# Patient Record
Sex: Female | Born: 1935 | Race: White | Hispanic: No | Smoking: Former smoker
Health system: Southern US, Community
[De-identification: ages and names within clinical notes are randomized; demographics above are authoritative.]

## PROBLEM LIST (undated history)

## (undated) DIAGNOSIS — M810 Age-related osteoporosis without current pathological fracture: Secondary | ICD-10-CM

## (undated) DIAGNOSIS — G3183 Dementia with Lewy bodies: Secondary | ICD-10-CM

## (undated) DIAGNOSIS — H409 Unspecified glaucoma: Secondary | ICD-10-CM

## (undated) DIAGNOSIS — F05 Delirium due to known physiological condition: Principal | ICD-10-CM

## (undated) DIAGNOSIS — F0281 Dementia in other diseases classified elsewhere with behavioral disturbance: Secondary | ICD-10-CM

## (undated) DIAGNOSIS — M546 Pain in thoracic spine: Secondary | ICD-10-CM

## (undated) DIAGNOSIS — F02818 Dementia in other diseases classified elsewhere, unspecified severity, with other behavioral disturbance: Secondary | ICD-10-CM

## (undated) DIAGNOSIS — J449 Chronic obstructive pulmonary disease, unspecified: Secondary | ICD-10-CM

## (undated) DIAGNOSIS — M4800 Spinal stenosis, site unspecified: Secondary | ICD-10-CM

## (undated) DIAGNOSIS — I4510 Unspecified right bundle-branch block: Secondary | ICD-10-CM

## (undated) DIAGNOSIS — M25569 Pain in unspecified knee: Secondary | ICD-10-CM

## (undated) DIAGNOSIS — R002 Palpitations: Secondary | ICD-10-CM

## (undated) DIAGNOSIS — F419 Anxiety disorder, unspecified: Secondary | ICD-10-CM

## (undated) DIAGNOSIS — R269 Unspecified abnormalities of gait and mobility: Secondary | ICD-10-CM

## (undated) DIAGNOSIS — G43009 Migraine without aura, not intractable, without status migrainosus: Secondary | ICD-10-CM

## (undated) HISTORY — DX: Age-related osteoporosis without current pathological fracture: M81.0

## (undated) HISTORY — DX: Pain in unspecified knee: M25.569

## (undated) HISTORY — DX: Unspecified right bundle-branch block: I45.10

## (undated) HISTORY — PX: TONSILLECTOMY: SUR1361

## (undated) HISTORY — DX: Palpitations: R00.2

## (undated) HISTORY — PX: BRONCHOSCOPY: SUR163

## (undated) HISTORY — DX: Delirium due to known physiological condition: F05

## (undated) HISTORY — DX: Anxiety disorder, unspecified: F41.9

## (undated) HISTORY — DX: Pain in thoracic spine: M54.6

## (undated) HISTORY — DX: Unspecified abnormalities of gait and mobility: R26.9

## (undated) HISTORY — DX: Spinal stenosis, site unspecified: M48.00

## (undated) HISTORY — DX: Migraine without aura, not intractable, without status migrainosus: G43.009

## (undated) HISTORY — DX: Unspecified glaucoma: H40.9

## (undated) HISTORY — PX: CATARACT EXTRACTION: SUR2

## (undated) HISTORY — PX: EYE SURGERY: SHX253

---

## 1998-09-02 ENCOUNTER — Ambulatory Visit (HOSPITAL_COMMUNITY): Admission: RE | Admit: 1998-09-02 | Discharge: 1998-09-02 | Payer: Self-pay | Admitting: Internal Medicine

## 1998-09-02 ENCOUNTER — Encounter: Payer: Self-pay | Admitting: Internal Medicine

## 1999-06-01 ENCOUNTER — Ambulatory Visit (HOSPITAL_COMMUNITY): Admission: RE | Admit: 1999-06-01 | Discharge: 1999-06-01 | Payer: Self-pay | Admitting: Internal Medicine

## 1999-06-01 ENCOUNTER — Encounter: Payer: Self-pay | Admitting: Internal Medicine

## 1999-06-01 ENCOUNTER — Encounter (INDEPENDENT_AMBULATORY_CARE_PROVIDER_SITE_OTHER): Payer: Self-pay | Admitting: Specialist

## 1999-08-20 ENCOUNTER — Encounter: Admission: RE | Admit: 1999-08-20 | Discharge: 1999-08-20 | Payer: Self-pay | Admitting: Internal Medicine

## 1999-08-20 ENCOUNTER — Encounter: Payer: Self-pay | Admitting: Internal Medicine

## 2000-08-10 ENCOUNTER — Encounter: Payer: Self-pay | Admitting: Internal Medicine

## 2000-08-10 ENCOUNTER — Encounter: Admission: RE | Admit: 2000-08-10 | Discharge: 2000-08-10 | Payer: Self-pay | Admitting: Internal Medicine

## 2001-08-14 ENCOUNTER — Encounter: Admission: RE | Admit: 2001-08-14 | Discharge: 2001-08-14 | Payer: Self-pay | Admitting: Internal Medicine

## 2001-08-14 ENCOUNTER — Encounter: Payer: Self-pay | Admitting: Internal Medicine

## 2002-12-13 ENCOUNTER — Encounter: Admission: RE | Admit: 2002-12-13 | Discharge: 2002-12-13 | Payer: Self-pay | Admitting: Internal Medicine

## 2002-12-13 ENCOUNTER — Encounter: Payer: Self-pay | Admitting: Internal Medicine

## 2003-12-17 ENCOUNTER — Encounter: Admission: RE | Admit: 2003-12-17 | Discharge: 2003-12-17 | Payer: Self-pay | Admitting: Internal Medicine

## 2003-12-20 ENCOUNTER — Encounter: Admission: RE | Admit: 2003-12-20 | Discharge: 2003-12-20 | Payer: Self-pay | Admitting: Internal Medicine

## 2005-02-05 ENCOUNTER — Encounter: Admission: RE | Admit: 2005-02-05 | Discharge: 2005-02-05 | Payer: Self-pay | Admitting: Internal Medicine

## 2006-02-22 ENCOUNTER — Encounter: Admission: RE | Admit: 2006-02-22 | Discharge: 2006-02-22 | Payer: Self-pay | Admitting: Internal Medicine

## 2006-03-29 ENCOUNTER — Ambulatory Visit: Payer: Self-pay | Admitting: Internal Medicine

## 2006-04-11 ENCOUNTER — Encounter: Payer: Self-pay | Admitting: Cardiology

## 2006-04-11 ENCOUNTER — Ambulatory Visit: Payer: Self-pay

## 2006-04-11 ENCOUNTER — Encounter: Payer: Self-pay | Admitting: Internal Medicine

## 2006-04-19 ENCOUNTER — Ambulatory Visit: Payer: Self-pay | Admitting: Internal Medicine

## 2006-05-17 ENCOUNTER — Ambulatory Visit: Payer: Self-pay | Admitting: Family Medicine

## 2006-05-30 ENCOUNTER — Ambulatory Visit: Payer: Self-pay | Admitting: Internal Medicine

## 2006-06-15 ENCOUNTER — Ambulatory Visit: Payer: Self-pay | Admitting: Internal Medicine

## 2006-08-11 ENCOUNTER — Encounter: Payer: Self-pay | Admitting: Internal Medicine

## 2006-09-02 ENCOUNTER — Ambulatory Visit: Payer: Self-pay | Admitting: Internal Medicine

## 2006-10-14 ENCOUNTER — Ambulatory Visit: Payer: Self-pay | Admitting: Internal Medicine

## 2006-10-21 ENCOUNTER — Encounter: Payer: Self-pay | Admitting: Internal Medicine

## 2007-03-15 ENCOUNTER — Encounter: Admission: RE | Admit: 2007-03-15 | Discharge: 2007-03-15 | Payer: Self-pay | Admitting: Internal Medicine

## 2007-12-26 ENCOUNTER — Ambulatory Visit: Payer: Self-pay | Admitting: Internal Medicine

## 2007-12-26 DIAGNOSIS — R000 Tachycardia, unspecified: Secondary | ICD-10-CM

## 2007-12-26 DIAGNOSIS — M810 Age-related osteoporosis without current pathological fracture: Secondary | ICD-10-CM | POA: Insufficient documentation

## 2007-12-26 DIAGNOSIS — M25569 Pain in unspecified knee: Secondary | ICD-10-CM | POA: Insufficient documentation

## 2007-12-26 DIAGNOSIS — F411 Generalized anxiety disorder: Secondary | ICD-10-CM | POA: Insufficient documentation

## 2007-12-26 DIAGNOSIS — R269 Unspecified abnormalities of gait and mobility: Secondary | ICD-10-CM

## 2007-12-26 DIAGNOSIS — G43009 Migraine without aura, not intractable, without status migrainosus: Secondary | ICD-10-CM | POA: Insufficient documentation

## 2007-12-26 DIAGNOSIS — H409 Unspecified glaucoma: Secondary | ICD-10-CM | POA: Insufficient documentation

## 2007-12-26 LAB — CONVERTED CEMR LAB
AST: 20 units/L (ref 0–37)
Alkaline Phosphatase: 77 units/L (ref 39–117)
Basophils Absolute: 0.1 10*3/uL (ref 0.0–0.1)
Bilirubin, Direct: 0.1 mg/dL (ref 0.0–0.3)
Chloride: 109 meq/L (ref 96–112)
Creatinine, Ser: 1 mg/dL (ref 0.4–1.2)
Eosinophils Absolute: 0.1 10*3/uL (ref 0.0–0.6)
Free T4: 0.9 ng/dL (ref 0.6–1.6)
GFR calc non Af Amer: 57 mL/min
Lymphocytes Relative: 24.2 % (ref 12.0–46.0)
MCHC: 32.7 g/dL (ref 30.0–36.0)
MCV: 90.7 fL (ref 78.0–100.0)
Neutrophils Relative %: 64.1 % (ref 43.0–77.0)
Platelets: 209 10*3/uL (ref 150–400)
Potassium: 4.7 meq/L (ref 3.5–5.1)
Sodium: 144 meq/L (ref 135–145)
Total Bilirubin: 0.6 mg/dL (ref 0.3–1.2)

## 2007-12-27 ENCOUNTER — Ambulatory Visit: Payer: Self-pay | Admitting: Internal Medicine

## 2007-12-28 DIAGNOSIS — R519 Headache, unspecified: Secondary | ICD-10-CM | POA: Insufficient documentation

## 2007-12-28 DIAGNOSIS — M899 Disorder of bone, unspecified: Secondary | ICD-10-CM | POA: Insufficient documentation

## 2007-12-28 DIAGNOSIS — R51 Headache: Secondary | ICD-10-CM

## 2007-12-28 DIAGNOSIS — M949 Disorder of cartilage, unspecified: Secondary | ICD-10-CM

## 2008-01-01 ENCOUNTER — Telehealth: Payer: Self-pay | Admitting: Internal Medicine

## 2008-01-01 LAB — CONVERTED CEMR LAB: Vit D, 1,25-Dihydroxy: 27 — ABNORMAL LOW (ref 30–89)

## 2008-01-22 ENCOUNTER — Telehealth: Payer: Self-pay | Admitting: Internal Medicine

## 2008-03-19 ENCOUNTER — Encounter: Admission: RE | Admit: 2008-03-19 | Discharge: 2008-03-19 | Payer: Self-pay | Admitting: Internal Medicine

## 2008-03-21 ENCOUNTER — Encounter: Payer: Self-pay | Admitting: Internal Medicine

## 2008-03-21 ENCOUNTER — Ambulatory Visit: Payer: Self-pay | Admitting: Internal Medicine

## 2008-03-25 ENCOUNTER — Ambulatory Visit: Payer: Self-pay | Admitting: Internal Medicine

## 2008-03-25 LAB — CONVERTED CEMR LAB: Blood Glucose, Fingerstick: 152

## 2008-04-16 ENCOUNTER — Ambulatory Visit: Payer: Self-pay | Admitting: Internal Medicine

## 2008-04-16 DIAGNOSIS — M546 Pain in thoracic spine: Secondary | ICD-10-CM

## 2008-04-16 DIAGNOSIS — I4510 Unspecified right bundle-branch block: Secondary | ICD-10-CM | POA: Insufficient documentation

## 2008-06-03 ENCOUNTER — Ambulatory Visit: Payer: Self-pay | Admitting: Internal Medicine

## 2008-06-03 DIAGNOSIS — R030 Elevated blood-pressure reading, without diagnosis of hypertension: Secondary | ICD-10-CM

## 2008-08-12 ENCOUNTER — Ambulatory Visit: Payer: Self-pay | Admitting: Internal Medicine

## 2008-10-03 ENCOUNTER — Telehealth: Payer: Self-pay | Admitting: Internal Medicine

## 2008-12-06 ENCOUNTER — Ambulatory Visit: Payer: Self-pay | Admitting: Internal Medicine

## 2008-12-23 ENCOUNTER — Ambulatory Visit: Payer: Self-pay | Admitting: Internal Medicine

## 2008-12-23 ENCOUNTER — Encounter: Payer: Self-pay | Admitting: Internal Medicine

## 2008-12-23 DIAGNOSIS — R002 Palpitations: Secondary | ICD-10-CM | POA: Insufficient documentation

## 2008-12-23 DIAGNOSIS — R0789 Other chest pain: Secondary | ICD-10-CM

## 2008-12-27 ENCOUNTER — Ambulatory Visit: Payer: Self-pay | Admitting: Internal Medicine

## 2009-01-08 ENCOUNTER — Ambulatory Visit: Payer: Self-pay

## 2009-01-08 ENCOUNTER — Encounter: Payer: Self-pay | Admitting: Internal Medicine

## 2009-01-15 ENCOUNTER — Telehealth (INDEPENDENT_AMBULATORY_CARE_PROVIDER_SITE_OTHER): Payer: Self-pay | Admitting: *Deleted

## 2009-01-20 ENCOUNTER — Ambulatory Visit: Payer: Self-pay | Admitting: Internal Medicine

## 2009-01-20 ENCOUNTER — Encounter: Payer: Self-pay | Admitting: Internal Medicine

## 2009-02-19 ENCOUNTER — Telehealth: Payer: Self-pay | Admitting: Internal Medicine

## 2009-02-28 ENCOUNTER — Ambulatory Visit: Payer: Self-pay | Admitting: Internal Medicine

## 2009-02-28 DIAGNOSIS — R5381 Other malaise: Secondary | ICD-10-CM

## 2009-02-28 DIAGNOSIS — R5383 Other fatigue: Secondary | ICD-10-CM

## 2009-03-20 ENCOUNTER — Encounter: Admission: RE | Admit: 2009-03-20 | Discharge: 2009-03-20 | Payer: Self-pay | Admitting: Internal Medicine

## 2009-04-17 ENCOUNTER — Telehealth: Payer: Self-pay | Admitting: Internal Medicine

## 2009-04-24 ENCOUNTER — Telehealth (INDEPENDENT_AMBULATORY_CARE_PROVIDER_SITE_OTHER): Payer: Self-pay | Admitting: *Deleted

## 2009-07-28 ENCOUNTER — Ambulatory Visit: Payer: Self-pay | Admitting: Internal Medicine

## 2009-07-28 DIAGNOSIS — F17200 Nicotine dependence, unspecified, uncomplicated: Secondary | ICD-10-CM

## 2009-09-01 ENCOUNTER — Ambulatory Visit: Payer: Self-pay | Admitting: Internal Medicine

## 2009-09-05 ENCOUNTER — Ambulatory Visit: Payer: Self-pay | Admitting: Internal Medicine

## 2009-09-08 LAB — CONVERTED CEMR LAB
Albumin: 3.6 g/dL (ref 3.5–5.2)
Basophils Absolute: 0.1 10*3/uL (ref 0.0–0.1)
Bilirubin, Direct: 0 mg/dL (ref 0.0–0.3)
Chloride: 108 meq/L (ref 96–112)
Cholesterol: 221 mg/dL — ABNORMAL HIGH (ref 0–200)
Direct LDL: 129.6 mg/dL
Free T4: 1 ng/dL (ref 0.6–1.6)
HDL: 69.1 mg/dL (ref >39.00)
Hemoglobin: 13.9 g/dL (ref 12.0–15.0)
Lymphocytes Relative: 26.4 % (ref 12.0–46.0)
Monocytes Relative: 9.5 % (ref 3.0–12.0)
Neutro Abs: 3.2 10*3/uL (ref 1.4–7.7)
Potassium: 4 meq/L (ref 3.5–5.1)
RDW: 12.9 % (ref 11.5–14.6)
TSH: 1.87 microintl units/mL (ref 0.35–5.50)
Total Protein: 6.3 g/dL (ref 6.0–8.3)
VLDL: 25.2 mg/dL (ref 0.0–40.0)
WBC: 5.5 10*3/uL (ref 4.5–10.5)

## 2009-12-29 ENCOUNTER — Ambulatory Visit: Payer: Self-pay | Admitting: Internal Medicine

## 2009-12-29 DIAGNOSIS — M1990 Unspecified osteoarthritis, unspecified site: Secondary | ICD-10-CM | POA: Insufficient documentation

## 2010-04-10 ENCOUNTER — Encounter: Admission: RE | Admit: 2010-04-10 | Discharge: 2010-04-10 | Payer: Self-pay | Admitting: Internal Medicine

## 2010-04-10 LAB — HM MAMMOGRAPHY

## 2010-05-29 ENCOUNTER — Encounter: Payer: Self-pay | Admitting: Internal Medicine

## 2010-06-07 ENCOUNTER — Encounter: Payer: Self-pay | Admitting: Internal Medicine

## 2010-07-08 ENCOUNTER — Encounter: Admission: RE | Admit: 2010-07-08 | Discharge: 2010-07-08 | Payer: Self-pay | Admitting: Family Medicine

## 2010-07-08 ENCOUNTER — Encounter: Payer: Self-pay | Admitting: Internal Medicine

## 2010-07-10 ENCOUNTER — Encounter: Payer: Self-pay | Admitting: Internal Medicine

## 2010-07-13 ENCOUNTER — Ambulatory Visit: Payer: Self-pay | Admitting: Internal Medicine

## 2010-07-13 DIAGNOSIS — M479 Spondylosis, unspecified: Secondary | ICD-10-CM | POA: Insufficient documentation

## 2010-07-21 ENCOUNTER — Encounter
Admission: RE | Admit: 2010-07-21 | Discharge: 2010-09-24 | Payer: Self-pay | Source: Home / Self Care | Attending: Family Medicine | Admitting: Family Medicine

## 2010-11-05 NOTE — Letter (Signed)
Summary: [facility]-Recheck left leg  [facility]-Reche[facility] left leg   Imported By: Maryln Gottron 07/28/2010 09:54:49  _____________________________________________________________________  External Attachment:    Type:   Image     Comment:   External Document

## 2010-11-05 NOTE — Letter (Signed)
Summary: [facility]-Weakness in Legs  [facility]-Weakness in Legs   Imported By: Maryln Gottron 07/28/2010 09:50:29  _____________________________________________________________________  External Attachment:    Type:   Image     Comment:   External Document

## 2010-11-05 NOTE — Assessment & Plan Note (Signed)
Summary: 6 MTH ROV // RS   Vital Signs:  Patient profile:   77 year old female Menstrual status:  postmenopausal Height:      56 inches Weight:      103 pounds BMI:     23.18 Pulse rate:   88 / minute BP sitting:   110 / 80  (right arm) Cuff size:   regular  Vitals Entered By: Romualdo Bolk, CMA (AAMA) (July 13, 2010 1:28 PM) CC: follow-up visit   History of Present Illness: Breanna Dillon comes in today  for follow up of  multiple medical problems . However since her last visit with [place] she had an episode of hert left leg giving out and was seen in  [place] urgent [facility]        and  had x rays.     she denied any pain or specific falling or otherwise specific weakness. Of note this is different than the problem that she had with her right leg  that had previously been evaluated by neurology with the unclear diagnosis.  It was felt she had possible radiculopathy or neuritis and she was  rx with prednisone  which helped a good bit. help     .    an MRI was done of her spine and hasn't been told the results yet   .. Dr. Hal Hope told her that she hadarthritis in her back.   leg is good now .   however she still feels like she must hold onto things at times. hol  on.  Fell in [place]. under [employer] different circumstances. Has had MRI   this past week.    ? results .  when left leg feels wobbly gets discomfort tin upper back .no numbness or pain today.  she denies any chest pain shortness of breath difficulty with speech or cognition. She denies groin pain.    Preventive Screening-Counseling & Management  Alcohol-Tobacco     Alcohol drinks/day: <1     Alcohol type: beer     Smoking Status: quit < 6 months     Packs/Day: <0.25     Year Quit: 5 years ago  Caffeine-Diet-Exercise     Caffeine use/day: 2-3     Does Patient Exercise: no  Current Medications (verified): 1)  Travatan Z 0.004 %  Soln (Travoprost) .... Once Daily 2)  Vitamin D 1000 Unit  Tabs (Cholecalciferol) .Marland Kitchen.. 1  By Mouth Once Daily 3)  Aspirin 325 Mg  Tabs (Aspirin) 4)  Sm Calcium/vitamin D 500-200 Mg-Unit Tabs (Calcium Carbonate-Vitamin D) .... Take 1 Tablet By Mouth Once A Day 5)  St Johns Wort 300 Mg Tabs ([address] Johns Wort)  Allergies (verified): 1)  Codeine Phosphate (Codeine Phosphate)  Past History:  Past medical, surgical, family and social histories (including risk factors) reviewed, and no changes noted (except as noted below).  Past Medical History: Reviewed history from 12/23/2008 and no changes required. g3p3 Mild spinal stenosis on back MRI   see eval 2007 and 2008 for r leg gait problem ? dyskinesia  mri chronic changes  RIGHT BUNDLE BRANCH BLOCK (ICD-426.4) BACK PAIN, THORACIC REGION, RIGHT (ICD-724.1) GAIT DISTURBANCE (ICD-781.2) ANXIETY KNEE PAIN, LEFT (ICD-719.46) GLAUCOMA (ICD-365.9) COMMON MIGRAINE (ICD-346.10) OSTEOPOROSIS (ICD-733.00)    Past Surgical History: Reviewed history from 12/23/2008 and no changes required. Denies surgical history Tonsillectomy                 Past History:  Care Management: Cardiology: Alred  Neurology: Pearlean Brownie  Family History: Reviewed history from 08/12/2008 and no changes required. Family History of Arthritis mom OA Family History Hypertension Family History Kidney disease father  Family History Osteoporosis     Social History: Reviewed history from 07/28/2009 and no changes required. Patient lives in [place] with her spouse. She previously worked at [employer] in [employer]. She has subsequently retired. she attends [employer]. She drinks occasional alcohol and also smokes infrequently.     Tobacco Use - Yes. smokes 1/2 daily (01/20/09) recent travel to [place].  Review of Systems  The patient denies anorexia, fever, weight loss, weight gain, hoarseness, chest pain, syncope, dyspnea on exertion, prolonged cough, abdominal pain, transient blindness, abnormal bleeding, enlarged lymph nodes, angioedema,  and incontinence.    Physical Exam  General:  Well-developed,well-nourished,in no acute distress; alert,appropriate and cooperative throughout examination Head:  normocephalic and atraumatic.   Eyes:  vision grossly intact.   Neck:  No deformities, masses, or tenderness noted. Lungs:  normal respiratory effort, no intercostal retractions, and no accessory muscle use.   Heart:  normal rate, regular rhythm, no murmur, and no gallop.  ocass premature beat Abdomen:  Bowel sounds positive,abdomen soft and non-tender without masses, organomegaly or noted. Msk:  no joint swelling and no joint warmth.  OA changes  Pulses:  pulses intact without delay   Extremities:  no clubbing cyanosis or edema  Neurologic:  alert & oriented X3.  independent gait although tends to hold on and feels wobbly left leg     nl cognition and speech.  no obvious focal deficit Skin:  turgor normal, color normal, no ecchymoses, and no petechiae.   Cervical Nodes:  No lymphadenopathy noted Psych:  Normal eye contact, appropriate affect. Cognition appears normal.    Impression & Recommendations:  Problem # 1:  DEGENERATIVE JOINT DISEASE, LUMBAR SPINE (ICD-721.90) left leg signs   .   and  will need follow up opinion.      get more information  from the urgent care note.   felt much better on prednisone. I am unsure if the problem is her spine, leg ,or hip.   Problem # 2:  GLAUCOMA (ICD-365.9) recurrent right foot issues no progressivefoot   Problem # 3:  GAIT DISTURBANCE (ICD-781.2) hx of same and had neuro and ortho eval in the past  over 3 years ago for  right leg signs    there appears to be no acute neurologic event.  Complete Medication List: 1)  Travatan Z 0.004 % Soln (Travoprost) .... Once daily 2)  Vitamin D 1000 Unit Tabs (Cholecalciferol) .Marland Kitchen.. 1 by mouth once daily 3)  Aspirin 325 Mg Tabs (Aspirin) 4)  Sm Calcium/vitamin D 500-200 Mg-unit Tabs (Calcium carbonate-vitamin d) .... Take 1 tablet by mouth  once a day 5)  St Johns Wort 300 Mg Tabs (St johns wort)  Other Orders: Admin 1st Vaccine (16109) Flu Vaccine 25yrs + [phone])  Patient Instructions: 1)  let me review the records from Dr Hal Hope   2)  and then decide on plan follow up . 3)  No new meds now.      Flu Vaccine Consent Questions     Do you have a history of severe allergic reactions to this vaccine? no    Any prior history of allergic reactions to egg and/or gelatin? no    Do you have a sensitivity to the preservative Thimersol? no    Do you have a past history of Guillan-Barre Syndrome? no    Do  you currently have an acute febrile illness? no    Have you ever had a severe reaction to latex? no    Vaccine information given and explained to patient? yes    Are you currently pregnant? no    Lot Number:[id number]   Exp Date:04/03/2011   Site Given  Left Deltoid IMlu1 Romualdo Bolk, CMA (AAMA)  July 13, 2010 1:30 PM records obtained and reviewed after phone call requesting these.  MRI and notes showed mod DHD facet degeneration spinal stenosis  DJDdisease but no large herniations. Tarlov cyst.  chest x-ray and abdominal series negative [place] spine x-ray shows osteopenia and possible osteoporosis and scoliosis thyroid CK N/A N/A were normal sedimentation rate was 5  her CBC was otherwise normal.   Appended Document: 6 MTH ROV // RS inform patient that I reviewed her records and MRI report. She has degenerative disease of the spine but unclear if this caused her symptoms. Her lab work was normal.   If she has ongoing symptoms subs suggest we get an opinion either from a neurosurgeon or an Chief of Staff.  physical therapy may be in order.( may we refer?  Otherwise would schedule a checkup in six months.  Appended Document: 6 MTH ROV // RS Pt aware of results. Pt got a call from [facility]. They have got therapy in there. Dr. Christy Gentles started her on Therapy. Pt is going 10/18 for evaluation at  10am.

## 2010-11-05 NOTE — Letter (Signed)
Summary: [facility]-discuss MRI Results  [facility]-discuss MRI Results   Imported By: Maryln Gottron 07/28/2010 09:52:35  _____________________________________________________________________  External Attachment:    Type:   Image     Comment:   External Document

## 2010-11-05 NOTE — Assessment & Plan Note (Signed)
Summary: 4 MONTHS//CCM   Vital Signs:  Patient profile:   77 year old female Menstrual status:  postmenopausal Weight:      103 pounds O2 Sat:      90 % Temp:     98.2 degrees F oral Pulse rate:   80 / minute Resp:     16 per minute BP sitting:   110 / 72  Vitals Entered By: Lynann Beaver CMA (December 29, 2009 1:32 PM) CC: rov Is Patient Diabetic? No Pain Assessment Patient in pain? no        History of Present Illness: Breanna Dillon comesin  for 4 months follow up as directed . Palpitations: still happens in am.  . No exercise related symptom  REsp: No problems Left hand  numb at fingertips .  Does basket weaving . No tobacco  since last visit.  Gait still thinks off but no progression from when had neuro eval.     Preventive Screening-Counseling & Management  Alcohol-Tobacco     Alcohol drinks/day: <1     Alcohol type: beer     Smoking Status: quit < 6 months     Packs/Day: <0.25     Year Quit: 5 years ago  Caffeine-Diet-Exercise     Caffeine use/day: 2-3     Does Patient Exercise: no  Current Medications (verified): 1)  Travatan Z 0.004 %  Soln (Travoprost) .... Once Daily 2)  Vitamin D 1000 Unit  Tabs (Cholecalciferol) .Marland Kitchen.. 1 By Mouth Once Daily 3)  Aspirin 325 Mg  Tabs (Aspirin) 4)  Sm Calcium/vitamin D 500-200 Mg-Unit Tabs (Calcium Carbonate-Vitamin D) .... Take 1 Tablet By Mouth Once A Day 5)  St Johns Wort 300 Mg Tabs ([address] Johns Wort)  Allergies (verified): 1)  Codeine Phosphate (Codeine Phosphate)  Past History:  Past medical, surgical, family and social histories (including risk factors) reviewed for relevance to current acute and chronic problems.  Past Medical History: Reviewed history from 12/23/2008 and no changes required. g3p3 Mild spinal stenosis on back MRI   see eval 2007 and 2008 for r leg gait problem ? dyskinesia  mri chronic changes  RIGHT BUNDLE BRANCH BLOCK (ICD-426.4) BACK PAIN, THORACIC REGION, RIGHT (ICD-724.1) GAIT  DISTURBANCE (ICD-781.2) ANXIETY KNEE PAIN, LEFT (ICD-719.46) GLAUCOMA (ICD-365.9) COMMON MIGRAINE (ICD-346.10) OSTEOPOROSIS (ICD-733.00)    Past Surgical History: Reviewed history from 12/23/2008 and no changes required. Denies surgical history Tonsillectomy                 Family History: Reviewed history from 08/12/2008 and no changes required. Family History of Arthritis mom OA Family History Hypertension Family History Kidney disease father  Family History Osteoporosis     Social History: Reviewed history from 07/28/2009 and no changes required. Patient lives in [place] with her spouse. She previously worked at [employer] in [employer]. She has subsequently retired. she attends [employer]. She drinks occasional alcohol and also smokes infrequently.     Tobacco Use - Yes. smokes 1/2 daily (01/20/09) husband   ailing     recently   and losing weight. Smoking Status:  quit < 6 months  Review of Systems  The patient denies anorexia, fever, weight loss, weight gain, hoarseness, chest pain, syncope, dyspnea on exertion, peripheral edema, prolonged cough, hemoptysis, abdominal pain, transient blindness, depression, abnormal bleeding, enlarged lymph nodes, and angioedema.    Physical Exam  General:  Well-developed,well-nourished,in no acute distress; alert,appropriate and cooperative throughout examination Head:  normocephalic and atraumatic.   Eyes:  vision  grossly intact.   Chest Wall:  mild kyphosis Lungs:  normal respiratory effort, no intercostal retractions, no accessory muscle use, normal breath sounds, and no dullness.   Heart:  normal rate, regular rhythm, no murmur, and no gallop.   Abdomen:  Bowel sounds positive,abdomen soft  scaphoid and non-tender without masses, organomegaly or   noted. Msk:  left hand with OA changes   no redness or swelling  adequate rom.  Pulses:  pulses intact without delay   Extremities:  no clubbing cyanosis or edema    Neurologic:  alert & oriented X3, strength normal in all extremities, and DTRs symmetrical and normal.   Skin:  turgor normal, color normal, no petechiae, and no purpura.   Cervical Nodes:  No lymphadenopathy noted Psych:  Oriented X3, good eye contact, not anxious appearing, and not depressed appearing.     Impression & Recommendations:  Problem # 1:  GAIT DISTURBANCE (ICD-781.2) Assessment Unchanged agree with balance    training.    Problem # 2:  PALPITATIONS (ICD-785.1) Assessment: Unchanged no associated symptom   Problem # 3:  DEGENERATIVE JOINT DISEASE (ICD-715.90) hand  call if progressive symptom  poss overuse recently.  Her updated medication list for this problem includes:    Aspirin 325 Mg Tabs (Aspirin)  Problem # 4:  ANXIETY STATE, UNSPECIFIED (ICD-300.00) stable .  using st johns wort  cautioned about drug interactions if  other meds needed.  Complete Medication List: 1)  Travatan Z 0.004 % Soln (Travoprost) .... Once daily 2)  Vitamin D 1000 Unit Tabs (Cholecalciferol) .Marland Kitchen.. 1 by mouth once daily 3)  Aspirin 325 Mg Tabs (Aspirin) 4)  Sm Calcium/vitamin D 500-200 Mg-unit Tabs (Calcium carbonate-vitamin d) .... Take 1 tablet by mouth once a day 5)  St Johns Wort 300 Mg Tabs (St johns wort)  Patient Instructions: 1)  Medicare wellness visit in 6 months   ( 30 minutes )  2)  can do labs at that visit as appropriate, 3)  Agree with balance  exercises and tobacco free .

## 2011-02-24 ENCOUNTER — Encounter: Payer: Self-pay | Admitting: Internal Medicine

## 2011-02-24 DIAGNOSIS — M4800 Spinal stenosis, site unspecified: Secondary | ICD-10-CM | POA: Insufficient documentation

## 2011-02-26 ENCOUNTER — Encounter: Payer: Self-pay | Admitting: Internal Medicine

## 2011-02-26 ENCOUNTER — Ambulatory Visit (INDEPENDENT_AMBULATORY_CARE_PROVIDER_SITE_OTHER): Payer: Medicare Other | Admitting: Internal Medicine

## 2011-02-26 VITALS — BP 110/76 | HR 43 | Temp 99.0°F | Wt 98.0 lb

## 2011-02-26 DIAGNOSIS — R002 Palpitations: Secondary | ICD-10-CM

## 2011-02-26 DIAGNOSIS — M4800 Spinal stenosis, site unspecified: Secondary | ICD-10-CM

## 2011-02-26 DIAGNOSIS — I4510 Unspecified right bundle-branch block: Secondary | ICD-10-CM

## 2011-02-26 DIAGNOSIS — F17200 Nicotine dependence, unspecified, uncomplicated: Secondary | ICD-10-CM

## 2011-02-26 DIAGNOSIS — R269 Unspecified abnormalities of gait and mobility: Secondary | ICD-10-CM

## 2011-02-26 DIAGNOSIS — F411 Generalized anxiety disorder: Secondary | ICD-10-CM

## 2011-02-26 DIAGNOSIS — R030 Elevated blood-pressure reading, without diagnosis of hypertension: Secondary | ICD-10-CM

## 2011-02-26 LAB — SEDIMENTATION RATE: Sed Rate: 6 mm/hr (ref 0–22)

## 2011-02-26 LAB — CBC WITH DIFFERENTIAL/PLATELET
Eosinophils Relative: 1.6 % (ref 0.0–5.0)
Monocytes Relative: 8.5 % (ref 3.0–12.0)
Neutrophils Relative %: 62.6 % (ref 43.0–77.0)
Platelets: 191 10*3/uL (ref 150.0–400.0)
WBC: 5.9 10*3/uL (ref 4.5–10.5)

## 2011-02-26 LAB — POCT URINALYSIS DIPSTICK
Glucose, UA: NEGATIVE
Nitrite, UA: NEGATIVE
Protein, UA: NEGATIVE
Urobilinogen, UA: 0.2

## 2011-02-26 LAB — T3, FREE: T3, Free: 2.4 pg/mL (ref 2.3–4.2)

## 2011-02-26 LAB — BASIC METABOLIC PANEL
Calcium: 9.4 mg/dL (ref 8.4–10.5)
Creatinine, Ser: 1.1 mg/dL (ref 0.4–1.2)
GFR: 50.95 mL/min — ABNORMAL LOW (ref >60.00)
Glucose, Bld: 91 mg/dL (ref 70–99)
Sodium: 141 mEq/L (ref 135–145)

## 2011-02-26 LAB — TSH: TSH: 1.03 u[IU]/mL (ref 0.35–5.50)

## 2011-02-26 MED ORDER — LORAZEPAM 0.5 MG PO TABS: 0.5000 mg | ORAL_TABLET | Freq: Three times a day (TID) | ORAL | Status: DC | PRN

## 2011-02-26 NOTE — Patient Instructions (Addendum)
Stop the tobacco I am unsure why you having your symptoms again all could be anxiety. Her last stress test was in 2o10. I think we should repeat an echo test of your heart which was about 2007 and then get cardiology to see you again. It's possible he need a test that monitors her heart rhythm. In the meantime we will get laboratory studies to make sure he did not develop thyroid disease.  And we can try a small amount of anxiety medication such as Ativan. I am not sure what medication you were on before for this. But I think it was Lexapro and Ativan. Plan followup a month or so. Or as needed

## 2011-02-26 NOTE — Progress Notes (Signed)
  Subjective:    Patient ID: Breanna Dillon, female    DOB: 12/26/1929, 77 y.o.   MRN: [id number]  HPI Patient comes in today because of concern about panic attack and heart pounding. Recently she has had increasing problems with her heart pounding which she described as occurring previously just with exercise and now when she moves at all. It doesn't wake her up at night and feels well at night. She is under a lot of stress related to her husband's situation. But has had no syncope but recently did have a fall where her foot caught on something and she fell tried to help someone.  No major changes in memory speech or hearing however she thinks that her abnormality of gait is a bit worse. She has a history of degenerative spine disease and spinal stenosis. Some had been treated with prednisone in the past.  She has a history of similar symptoms with her heart pounding and had some type of evaluation not in the electronic record. She wonders if this is anxiety at some point medication was used. Currently she is taking St. John's wort capsules about 3 times a week as needed for anxiety. She started back on occasional tobacco socially 3-4 days a week she uses caffeine 3 cups of instant coffee a day and has been doing on for years. Review of old record she did have a dobutamine stress test in 2010 on an echocardiogram in 2007 which showed mild mitral valve prolapse with some myxomatous changes. Normal LV function. She reports having had either an event monitor or Holter but I do not see that in the record.  Review of Systems No fever change in cognition bleeding bruising except left arm where she fell.  Has had elevated blood pressure readings before on medication.  Has history of mild spinal stenosis and degenerative disc disease. MRI was done in the fall of 2011 her symptoms responded to prednisone.    Objective:   Physical Exam Well-developed slender but nourished white female in no acute distress  well-groomed articulate HEENT: Normocephalic ;atraumatic , Eyes;  PERRL, EOMs  Full, lids and conjunctiva clear,,Ears: no deformities, canals nl, TM landmarks normal, Nose: no deformity or discharge  Mouth : OP clear without lesion or edema . NECK NO BRUIT OR  M,ASSES  Chest:  Clear to A&P without wheezes rales or rhonchi some dec BS  CV:  S1-S2  ? Intermittent mid syst HS  ? If daistolic sound llsb  no gallops or systolic  murmurs peripheral perfusion is normal,  BP readings 145/70 right 140/72 left  Gait slightly stiff  Pronates and turns out left foot.  No dragging  No cogwheeling.  Skin:  Ecchymosis under her left arm no petechiae or other changes except age-related   EKG rate 60 right bundle branch block no acute changes.  Assessment & Plan:  Palpitations/ heart pounding  Recurring possibly from anxiety but seems pretty dramatic and related to activity. Reviewed old EMR recommend rehabilitation  rule out metabolic anemia are related to treat for anxiety and have cardiology to see. Tobacco recommend DC Right bundle branch block Degenerative joint disease and spinal stenosis Anxiety

## 2011-03-03 ENCOUNTER — Encounter: Payer: Self-pay | Admitting: *Deleted

## 2011-03-11 ENCOUNTER — Encounter: Payer: Self-pay | Admitting: *Deleted

## 2011-03-11 ENCOUNTER — Ambulatory Visit (HOSPITAL_COMMUNITY): Payer: Medicare Other | Attending: Internal Medicine | Admitting: Radiology

## 2011-03-11 DIAGNOSIS — R269 Unspecified abnormalities of gait and mobility: Secondary | ICD-10-CM

## 2011-03-11 DIAGNOSIS — I4510 Unspecified right bundle-branch block: Secondary | ICD-10-CM | POA: Insufficient documentation

## 2011-03-11 DIAGNOSIS — I359 Nonrheumatic aortic valve disorder, unspecified: Secondary | ICD-10-CM

## 2011-03-11 DIAGNOSIS — F17200 Nicotine dependence, unspecified, uncomplicated: Secondary | ICD-10-CM | POA: Insufficient documentation

## 2011-03-11 DIAGNOSIS — I080 Rheumatic disorders of both mitral and aortic valves: Secondary | ICD-10-CM | POA: Insufficient documentation

## 2011-03-11 DIAGNOSIS — R030 Elevated blood-pressure reading, without diagnosis of hypertension: Secondary | ICD-10-CM

## 2011-03-11 DIAGNOSIS — R002 Palpitations: Secondary | ICD-10-CM | POA: Insufficient documentation

## 2011-03-11 DIAGNOSIS — I079 Rheumatic tricuspid valve disease, unspecified: Secondary | ICD-10-CM | POA: Insufficient documentation

## 2011-03-12 ENCOUNTER — Encounter: Payer: Self-pay | Admitting: Internal Medicine

## 2011-03-12 ENCOUNTER — Ambulatory Visit (INDEPENDENT_AMBULATORY_CARE_PROVIDER_SITE_OTHER): Payer: Medicare Other | Admitting: Internal Medicine

## 2011-03-12 DIAGNOSIS — R002 Palpitations: Secondary | ICD-10-CM

## 2011-03-12 DIAGNOSIS — F411 Generalized anxiety disorder: Secondary | ICD-10-CM

## 2011-03-12 NOTE — Patient Instructions (Signed)
Your physician recommends that you schedule a follow-up appointment as needed with Dr Taylor  

## 2011-03-14 ENCOUNTER — Encounter: Payer: Self-pay | Admitting: Internal Medicine

## 2011-03-14 NOTE — Progress Notes (Signed)
HPI Breanna Dillon is referred today by Dr. Fabian Sharp for evaluation of spells in the setting of RBBB. The patient had seen Dr. Johney Frame in the past. She mainly c/o spells of weakness and a wobbling sensation. She denies frank syncope. She has had an extensive neuro workup. She feels palpitations and sudden dizzy spells. No c/p. She smokes cigarettes. By her descriptions she feels a fluttering in her chest. No other complaints. She has baseline dyspnea likely due to COPD. Allergies  Allergen Reactions  . Codeine Phosphate     REACTION: unspecified     Current Outpatient Prescriptions  Medication Sig Dispense Refill  . aspirin 325 MG tablet Take 325 mg by mouth daily.        . calcium carbonate (OS-CAL - DOSED IN MG OF ELEMENTAL CALCIUM) 1250 MG tablet Take 1 tablet by mouth daily.        Marland Kitchen LORazepam (ATIVAN) 0.5 MG tablet Take 1 tablet (0.5 mg total) by mouth every 8 (eight) hours as needed for anxiety.  40 tablet  0  . Multiple Vitamin (MULTIVITAMIN) capsule Take 1 capsule by mouth daily.        . Multiple Vitamins-Minerals (OCUVITE PO) Take by mouth daily.        Satira Sark Johns Wort 1000 MG CAPS Take by mouth.        . travoprost, benzalkonium, (TRAVATAN Z) 0.004 % ophthalmic solution 1 drop at bedtime.        Marland Kitchen VITAMIN D, ERGOCALCIFEROL, PO Take by mouth.        . Zinc Sulfate (ZINC 15 PO) Take by mouth.           Past Medical History  Diagnosis Date  . Spinal stenosis     mild on back MRI, see eval 2007 and 2008 for r leg gait problem ? dyskinesia, MRI chronic changes  . Right bundle branch block   . Pain in thoracic spine   . Abnormality of gait   . Anxiety   . Pain in joint, lower leg   . Unspecified glaucoma   . Migraine without aura, without mention of intractable migraine without mention of status migrainosus   . Osteoporosis, unspecified   . Palpitations     ROS:   All systems reviewed and negative except as noted in the HPI.   Past Surgical History  Procedure Date  .  Tonsillectomy      Family History  Problem Relation Age of Onset  . Osteoarthritis Mother   . Kidney disease Father      History   Social History  . Marital Status: Married    Spouse Name: N/A    Number of Children: N/A  . Years of Education: N/A   Occupational History  . Not on file.   Social History Main Topics  . Smoking status: Former Smoker    Types: Cigarettes  . Smokeless tobacco: Not on file   Comment: 1 to 2 cigarettes   . Alcohol Use: Yes     occ  . Drug Use: Not on file  . Sexually Active: Not on file   Other Topics Concern  . Not on file   Social History Narrative   Lives in [place] with spousePreviously worked at [employer] in campus [employer] travel to [employer]     BP 148/80  Pulse 80  Resp 16  Ht 5\' 5"  (1.651 m)  Wt 100 lb 1.9 oz (45.414 kg)  BMI 16.66 kg/m2  Physical Exam:  Elderly  appearing NAD HEENT: Unremarkable Neck:  No JVD, no thyromegally Lymphatics:  No adenopathy Back:  No CVA tenderness Lungs:  Clear with decreased breath sounds in the bases. HEART:  Regular rate rhythm, no murmurs, no rubs, no clicks Abd:  Flat, positive bowel sounds, no organomegally, no rebound, no guarding Ext:  2 plus pulses, no edema, no cyanosis, no clubbing Skin:  No rashes no nodules Neuro:  CN II through XII intact, motor grossly intact  EKG NSR with RBBB.  Assess/Plan:

## 2011-03-14 NOTE — Assessment & Plan Note (Signed)
Her description of her spells sounds very much like PVC's. They are sudden in onset, and are not associated with frank syncope. I have discussed the treatment options and think that a period of watchful waiting is most appropriate. I doubt a monitor would show much that we could actually do anything about. I have discussed the warning signs that something more significant is going on and have asked her to call if she passes out or experiences sustained periods of heart racing.

## 2011-03-14 NOTE — Assessment & Plan Note (Signed)
Her anxiety makes her symptoms more bothersome. I have tried to reassure her.

## 2011-04-13 ENCOUNTER — Ambulatory Visit (INDEPENDENT_AMBULATORY_CARE_PROVIDER_SITE_OTHER): Payer: Medicare Other | Admitting: Internal Medicine

## 2011-04-13 ENCOUNTER — Encounter: Payer: Self-pay | Admitting: Internal Medicine

## 2011-04-13 DIAGNOSIS — R030 Elevated blood-pressure reading, without diagnosis of hypertension: Secondary | ICD-10-CM

## 2011-04-13 DIAGNOSIS — F411 Generalized anxiety disorder: Secondary | ICD-10-CM

## 2011-04-13 DIAGNOSIS — I4510 Unspecified right bundle-branch block: Secondary | ICD-10-CM

## 2011-04-13 DIAGNOSIS — M479 Spondylosis, unspecified: Secondary | ICD-10-CM

## 2011-04-13 DIAGNOSIS — R269 Unspecified abnormalities of gait and mobility: Secondary | ICD-10-CM

## 2011-04-13 DIAGNOSIS — R002 Palpitations: Secondary | ICD-10-CM

## 2011-04-13 NOTE — Patient Instructions (Signed)
Can try  Low dose of lorazepam  If needed but dont have to take if  Makes you feel bad. Rip Harbour to use tylenol for  Pain if needed.   You have back arthritis  And this is causing the pain issues. If you have a flare up   Then we could use a course of prednisone.

## 2011-04-18 ENCOUNTER — Encounter: Payer: Self-pay | Admitting: Internal Medicine

## 2011-04-18 NOTE — Progress Notes (Signed)
  Subjective:    Patient ID: Breanna Dillon, female    DOB: 10-Sep-1930, 77 y.o.   MRN: [id number]  HPI Patient comes in for fu of ongoing issues Still frustrated about swings in Bp palpitations and gait distrubance . Non that have progressed. Saw Dr Ladona Ridgel who felt these could be PVCs but no intervention needed at this time . To be followed if sx changet. Back pain lumbar and  thorax is still  problematic   Felt much better when was on prednisone when it flared up.   Review of Systems Neg sob falling syncope  Fever  Vision change  Lorazepam not that helpful.  ? Side effects  Past history family history social history reviewed in the electronic medical record.     Objective:   Physical Exam    WDWN in nad slender articulate   Oriented x 3 and no noted deficits in memory, attention, and speech.  Gait no change   No obv weakness or foot drop .   No tremor Labs reviewed with nl thyroid   And unremarkable cbc bmp  ua  Cr 1.1       Assessment & Plan:  Palpitations  Labs normal Gait disturbance   RIght le issue   Not progressive  but frustrating for her  Consider even from spinal DJD. Prednisone has helped in the past but his is not a long term solution however we can do burst therapy if  A flare up happens.  RBBB  No intervention needed Bp  Labile  Secondary anxiety,   Seems controlled .   Do not see any other disease to treat or intervention that would help her at this time.   Continue lifestyle intervention healthy eating and exercise .  Fall prevention. And follow up.  I agree that less is best with meds  Total visit > 50% spent counseling and coordinating care

## 2011-05-04 ENCOUNTER — Other Ambulatory Visit: Payer: Self-pay | Admitting: Dermatology

## 2011-06-18 ENCOUNTER — Ambulatory Visit (INDEPENDENT_AMBULATORY_CARE_PROVIDER_SITE_OTHER): Payer: Medicare Other | Admitting: Internal Medicine

## 2011-06-18 ENCOUNTER — Encounter: Payer: Self-pay | Admitting: Internal Medicine

## 2011-06-18 VITALS — BP 150/80 | HR 74 | Temp 97.5°F | Wt 99.0 lb

## 2011-06-18 DIAGNOSIS — F17200 Nicotine dependence, unspecified, uncomplicated: Secondary | ICD-10-CM

## 2011-06-18 DIAGNOSIS — F329 Major depressive disorder, single episode, unspecified: Secondary | ICD-10-CM | POA: Insufficient documentation

## 2011-06-18 DIAGNOSIS — I4510 Unspecified right bundle-branch block: Secondary | ICD-10-CM

## 2011-06-18 DIAGNOSIS — I951 Orthostatic hypotension: Secondary | ICD-10-CM

## 2011-06-18 DIAGNOSIS — R002 Palpitations: Secondary | ICD-10-CM

## 2011-06-18 DIAGNOSIS — R42 Dizziness and giddiness: Secondary | ICD-10-CM | POA: Insufficient documentation

## 2011-06-18 DIAGNOSIS — F341 Dysthymic disorder: Secondary | ICD-10-CM

## 2011-06-18 LAB — POCT HEMOGLOBIN: Hemoglobin: 14

## 2011-06-18 MED ORDER — SERTRALINE HCL 50 MG PO TABS: ORAL_TABLET | ORAL | Status: DC

## 2011-06-18 NOTE — Patient Instructions (Addendum)
Your exam check out well .   Sometime dehydration will add to your sx . Antidepressant low dose could be tried   If you wish.  But would have you stop the   [employer].  Recheck in   4 weeks or earlier if needed.

## 2011-06-18 NOTE — Progress Notes (Signed)
Subjective:    Patient ID: Breanna Dillon, female    DOB: [birth year], 77 y.o.   MRN: [id number]  HPI Patient comes in today for a couple of issues. Her husband has been through a lot of illnesses and has been here a couple but he is getting better.  She has been helping in his care.  She has a decreased appetite and doesn't like to get out of bed in the morning. On days that she has to do things she takes St. John's wort and even if it's imaginary she thinks it helps.  ? If depressed  . Is willing to try a  medication . She does not tolerate the Ativan very well.    If stands for a long time  about 20 or 30 minutes feels faint.   She has to sit down. This has happened intermittently for about the last 2 weeks.  She had this when she was much younger as a teenager and young adult but hasn't had this for a bottle. She is a bit worried because she is having a wedding anniversary party where she will have to stand and greet people.  Denies any chest pain or shortness of breath new neurologic symptoms bleeding anemia.   She is seeing cardiology in the past had an negative evaluation has a right bundle branch block degenerative joint disease no acute neurologic disease.     Ocass tobacco.   3 x per week.   Plans on stopping may need help Etoh none .   Review of Systems See history of present illness no fever vision loss sweats unusual rashes falling. Her arthritis is about the same and no change in cognition Past Medical History  Diagnosis Date  . Spinal stenosis     mild on back MRI, see eval 2007 and 2008 for r leg gait problem ? dyskinesia, MRI chronic changes  . Right bundle branch block   . Pain in thoracic spine   . Abnormality of gait   . Anxiety   . Pain in joint, lower leg   . Unspecified glaucoma   . Migraine without aura, without mention of intractable migraine without mention of status migrainosus   . Osteoporosis, unspecified   . Palpitations    Past Surgical History    Procedure Date  . Tonsillectomy     reports that she has quit smoking. Her smoking use included Cigarettes. She does not have any smokeless tobacco history on file. She reports that she drinks alcohol. Her drug history not on file. family history includes Kidney disease in her father and Osteoarthritis in her mother. Allergies  Allergen Reactions  . Codeine Phosphate     REACTION: unspecified       Objective:   Physical Exam  WDWN in nad   BP 140/72 right  Sitting legs crossed  pulse   80 rr  Neck supple no masses and no bruits. Chest:  Clear to A&P without wheezes rales or rhonchi CV:  S1-S2 no gallops or murmurs peripheral perfusion is normal Cognition intact  ; grossly non focal.   Lab Results  Component Value Date   HGB 14.0 06/18/2011   Lab Results  Component Value Date   WBC 5.9 02/26/2011   HGB 14.0 06/18/2011   HCT 40.5 02/26/2011   PLT 191.0 02/26/2011   CHOL 221* 09/05/2009   TRIG 126.0 09/05/2009   HDL 69.10 09/05/2009   LDLDIRECT 129.6 09/05/2009   ALT 19 09/05/2009   AST 19 09/05/2009  NA 141 02/26/2011   K 4.1 02/26/2011   CL 105 02/26/2011   CREATININE 1.1 02/26/2011   BUN 20 02/26/2011   CO2 30 02/26/2011   TSH 1.03 02/26/2011        Assessment & Plan:  Hx of lightheadedness when standing long periods of time presyncopal with hx for same    . Has had a negative cardiovascular evaluation in the last year.  No obvious new disease but did discuss hydration. This point no further evaluation.  Stress / depressive sx  Doesn't like to get out of bed at times. Discussed this and it is reasonable to try a small amount of Zoloft and follow up but stop  the St. John's wort  She will followup in 3-4 weeks with an appointment or as needed. Counseled about risk benefits potential side effects nuisance side effects.  Tobacco   occasional user needs to stop altogether Counseled.  Total visit > 50% spent counseling and coordinating care

## 2011-06-18 NOTE — Assessment & Plan Note (Signed)
Continuing no worse and  Living with them.

## 2011-07-12 ENCOUNTER — Ambulatory Visit: Payer: Medicare Other | Admitting: Internal Medicine

## 2011-07-28 ENCOUNTER — Encounter: Payer: Self-pay | Admitting: Internal Medicine

## 2011-07-28 ENCOUNTER — Ambulatory Visit (INDEPENDENT_AMBULATORY_CARE_PROVIDER_SITE_OTHER): Payer: Medicare Other | Admitting: Internal Medicine

## 2011-07-28 DIAGNOSIS — I951 Orthostatic hypotension: Secondary | ICD-10-CM

## 2011-07-28 DIAGNOSIS — F17200 Nicotine dependence, unspecified, uncomplicated: Secondary | ICD-10-CM

## 2011-07-28 DIAGNOSIS — F329 Major depressive disorder, single episode, unspecified: Secondary | ICD-10-CM

## 2011-07-28 DIAGNOSIS — F341 Dysthymic disorder: Secondary | ICD-10-CM

## 2011-07-28 DIAGNOSIS — R002 Palpitations: Secondary | ICD-10-CM

## 2011-07-28 DIAGNOSIS — R42 Dizziness and giddiness: Secondary | ICD-10-CM

## 2011-07-28 DIAGNOSIS — F411 Generalized anxiety disorder: Secondary | ICD-10-CM

## 2011-07-28 NOTE — Progress Notes (Signed)
  Subjective:    Patient ID: Breanna Dillon, female    DOB: 1930-06-20, 77 y.o.   MRN: [id number]  HPI Pt coms in for fu of meds. Husband having increasing medical problems  Thus stress trying not to excessivly worry . Seems to be tolerating this  Situation. No side effects except tired   So takes at night.   no light headed .  Some helpful.  tobacco occasional.    Review of Systems No cp sob pal[piations may be less no syncope and less dizziness Past history family history social history reviewed in the electronic medical record.     Objective:   Physical Exam Wd  slender in nad  Calm and good eye contact  BP reading 138/70 Chest:  Clear to A&P without wheezes rales or rhonchi CV:  S1-S2 no gallops or murmurs peripheral perfusion is normal Mood stable good eye contact appropriate fr situation     Assessment & Plan:  Mood reactive  Poss some improvement or help with meds  Poss some help with hubands condition problematic.  Avoid tobacco   rov in 2 months  Continue on meds  Total visit > 50% spent counseling and coordinating care  Flu shot today.

## 2011-07-28 NOTE — Patient Instructions (Signed)
Stay on 50 mg zoloft  Every day  OV in 2 months  Call if needed in the meantime.

## 2011-10-04 ENCOUNTER — Other Ambulatory Visit: Payer: Self-pay | Admitting: Internal Medicine

## 2011-10-11 ENCOUNTER — Ambulatory Visit (INDEPENDENT_AMBULATORY_CARE_PROVIDER_SITE_OTHER): Payer: Medicare Other | Admitting: Internal Medicine

## 2011-10-11 ENCOUNTER — Encounter: Payer: Self-pay | Admitting: Internal Medicine

## 2011-10-11 VITALS — BP 120/80 | HR 72 | Wt 95.0 lb

## 2011-10-11 DIAGNOSIS — F341 Dysthymic disorder: Secondary | ICD-10-CM

## 2011-10-11 DIAGNOSIS — Z638 Other specified problems related to primary support group: Secondary | ICD-10-CM

## 2011-10-11 DIAGNOSIS — F411 Generalized anxiety disorder: Secondary | ICD-10-CM

## 2011-10-11 DIAGNOSIS — R634 Abnormal weight loss: Secondary | ICD-10-CM

## 2011-10-11 DIAGNOSIS — Z6379 Other stressful life events affecting family and household: Secondary | ICD-10-CM

## 2011-10-11 DIAGNOSIS — J988 Other specified respiratory disorders: Secondary | ICD-10-CM

## 2011-10-11 DIAGNOSIS — F329 Major depressive disorder, single episode, unspecified: Secondary | ICD-10-CM

## 2011-10-11 NOTE — Progress Notes (Signed)
  Subjective:    Patient ID: Breanna Dillon, female    DOB: 1930-08-06, 78 y.o.   MRN: [id number]  HPI Pt comes in today for fu of medical issues . Since last visit  Had self illness and husband was hospitalized for bowel obstruction and pneumonia .  hosp for 2 weeks and in ICU .  And rehab.  Now he has home and she has 2 caretaker for him Still  Taking zoloft per  day 50 mg.  Also had the" flu" recently  And then  Was given tamiflu by husband stalked her but this didn't help and she was eventually treated with an antibiotic and a breathing machine Z-Pak also Mucinex.   Helped and now a lot better . Now.   Denies any significant side effects from the medication however is tired a lot. Is taking the Zoloft at night. No history of falling.  Review of Systems No new chest pain shortness of breath major changes in vision or hearing.  She states that her illness is probably the cause of her weight loss. But she is now feeling better.  Past history family history social history reviewed in the electronic medical record.     Objective:   Physical Exam WDWN in nad Wt Readings from Last 3 Encounters:  10/11/11 95 lb (43.092 kg)  07/28/11 98 lb (44.453 kg)  06/18/11 99 lb (44.906 kg)   WDWN slender in nad looks well  HEENT grossly nl  Neck: Supple without adenopathy or masses or bruits Chest:  Clear to A&P without wheezes rales or rhonchi CV:  S1-S2 no gallops or murmurs peripheral perfusion is normal No clubbing cyanosis or edema NEURO: Grossly normal and nonfocal today. Oriented x 3. Normal cognition, attention, speech. Not anxious or depressed appearing   Good eye contact .       Assessment & Plan:  Anxiety  Depressive rx  Husband's health is declining and stressful  Personal health event with respiratory infection now getting better. Small amount of weight loss probably secondary to above but is doing better now lung exam and pulse ox is okay today.  Total visit > 50%  spent counseling and coordinating care

## 2011-10-11 NOTE — Patient Instructions (Signed)
Continue zoloft for now . I think it is helping some.  Your lungs are clear now.  And oxygen level is good. Try to gain some weight. ROV  in 4-6 months for  Labs and  wellness .

## 2012-03-07 ENCOUNTER — Encounter: Payer: Self-pay | Admitting: Internal Medicine

## 2012-03-07 ENCOUNTER — Ambulatory Visit (INDEPENDENT_AMBULATORY_CARE_PROVIDER_SITE_OTHER): Payer: Medicare Other | Admitting: Internal Medicine

## 2012-03-07 ENCOUNTER — Other Ambulatory Visit: Payer: Self-pay | Admitting: Internal Medicine

## 2012-03-07 VITALS — BP 110/78 | HR 99 | Temp 97.4°F | Ht 64.5 in | Wt 95.0 lb

## 2012-03-07 DIAGNOSIS — Z0000 Encounter for general adult medical examination without abnormal findings: Secondary | ICD-10-CM | POA: Insufficient documentation

## 2012-03-07 DIAGNOSIS — M810 Age-related osteoporosis without current pathological fracture: Secondary | ICD-10-CM

## 2012-03-07 DIAGNOSIS — R269 Unspecified abnormalities of gait and mobility: Secondary | ICD-10-CM

## 2012-03-07 DIAGNOSIS — M6530 Trigger finger, unspecified finger: Secondary | ICD-10-CM

## 2012-03-07 DIAGNOSIS — F17200 Nicotine dependence, unspecified, uncomplicated: Secondary | ICD-10-CM

## 2012-03-07 DIAGNOSIS — M1990 Unspecified osteoarthritis, unspecified site: Secondary | ICD-10-CM

## 2012-03-07 DIAGNOSIS — F411 Generalized anxiety disorder: Secondary | ICD-10-CM

## 2012-03-07 DIAGNOSIS — R634 Abnormal weight loss: Secondary | ICD-10-CM

## 2012-03-07 DIAGNOSIS — M4800 Spinal stenosis, site unspecified: Secondary | ICD-10-CM

## 2012-03-07 DIAGNOSIS — Z79899 Other long term (current) drug therapy: Secondary | ICD-10-CM

## 2012-03-07 LAB — BASIC METABOLIC PANEL
BUN: 19 mg/dL (ref 6–23)
Calcium: 9.5 mg/dL (ref 8.4–10.5)
Chloride: 109 mEq/L (ref 96–112)
Creatinine, Ser: 1 mg/dL (ref 0.4–1.2)
GFR: 56.73 mL/min — ABNORMAL LOW (ref >60.00)

## 2012-03-07 LAB — HEPATIC FUNCTION PANEL
ALT: 19 U/L (ref 0–35)
Bilirubin, Direct: 0 mg/dL (ref 0.0–0.3)
Total Bilirubin: 0.3 mg/dL (ref 0.3–1.2)

## 2012-03-07 LAB — T3, FREE: T3, Free: 2.8 pg/mL (ref 2.3–4.2)

## 2012-03-07 LAB — LIPID PANEL
Cholesterol: 224 mg/dL — ABNORMAL HIGH (ref 0–200)
HDL: 91.3 mg/dL (ref >39.00)
Total CHOL/HDL Ratio: 2
Triglycerides: 67 mg/dL (ref 0.0–149.0)
VLDL: 13.4 mg/dL (ref 0.0–40.0)

## 2012-03-07 LAB — CBC WITH DIFFERENTIAL/PLATELET
Basophils Relative: 0.8 % (ref 0.0–3.0)
Eosinophils Relative: 1.7 % (ref 0.0–5.0)
Lymphocytes Relative: 19.4 % (ref 12.0–46.0)
MCV: 91.1 fl (ref 78.0–100.0)
Monocytes Absolute: 0.4 10*3/uL (ref 0.1–1.0)
Neutrophils Relative %: 71.3 % (ref 43.0–77.0)
Platelets: 182 10*3/uL (ref 150.0–400.0)
RBC: 4.78 Mil/uL (ref 3.87–5.11)
WBC: 6.4 10*3/uL (ref 4.5–10.5)

## 2012-03-07 LAB — SEDIMENTATION RATE: Sed Rate: 4 mm/hr (ref 0–22)

## 2012-03-07 NOTE — Progress Notes (Signed)
Subjective:    Patient ID: Breanna Dillon, female    DOB: 27-Oct-1929, 78 y.o.   MRN: [id number]  HPI Patient comes in today for preventive visit and follow-up of medical issues. Update  history since  last visit:  Coping after husbands death suddenly.   22-Nov-2022.   He was ailing and being evaluated and had the sudden death of a blood clot.  She still takes care with her gait to avoid falls she now has trigger finger on her left fifth pinky and middle finger making it hard to grip she is right-handed but is difficulty with her handwriting. She is still on the sertraline for anxiety no panic attacks hasn't been able to cry since her husband's death but has been busy. The only other prescription medicine is the medication for her eye disease she has glaucoma. Last eye check February 2013 Otherwise vitamin D aspirin.  Hearing: Good  Vision:  No limitations at present . Glasses on medicine for glaucoma  Safety:  Has smoke detector and wears seat belts.  No firearms. No excess sun exposure. Sees dentist regularly.  Falls:  None for 2 years  Advance directive :  Reviewed .  Memory: Felt to be good  , no concern from her or her family.  Depression: No anhedonia unusual crying or depressive symptoms  Nutrition: Eats well balanced diet; adequate calcium and vitamin D. No swallowing chewiing problems.  Injury: no major injuries in the last six months.  Other healthcare providers:  Reviewed today .  Social:  Breanna Dillon recently widowed  68 yo cat.   Preventive parameters: Up to date but shingles  due for mammogram.   ADLS:   There are no problems or need for assistance  driving, feeding, obtaining food, dressing, toileting and bathing, managing money using phone. She is independent.  Exercise  not currently smoking 2-3 cigarettes a day occasionally when she has certain meals alcohol occasional.    Review of Systems ROS:  GEN/ HEENT: No fever,   sweats headaches vision problems  hearing changes, she has had some weight loss over the last 4-6 months unintended she's just not as hungry see usual but no nausea vomiting bleeding syncope. She is trying to boost her intake is unsure if it could be related to her husband's death. CV/ PULM; No chest pain shortness of breath cough, syncope,edema  change in exercise tolerance. GI /GU: No adominal pain, vomiting, change in bowel habits. No blood in the stool. No significant GU symptoms. SKIN/HEME: ,no acute skin rashes suspicious lesions or bleeding. No lymphadenopathy, nodules, masses.  NEURO/ PSYCH:  No  newneurologic signs such as weakness numbness.  IMM/ Allergy: No unusual infections.  Allergy .   REST of 12 system review negative except as per HPI Outpatient Encounter Prescriptions as of 03/07/2012  Medication Sig Dispense Refill  . aspirin 325 MG tablet Take 325 mg by mouth daily.        . calcium carbonate (OS-CAL - DOSED IN MG OF ELEMENTAL CALCIUM) 1250 MG tablet Take 1 tablet by mouth daily.        . Multiple Vitamin (MULTIVITAMIN) capsule Take 1 capsule by mouth daily.        . Multiple Vitamins-Minerals (OCUVITE PO) Take by mouth daily.        . travoprost, benzalkonium, (TRAVATAN Z) 0.004 % ophthalmic solution 1 drop at bedtime.        . vitamin B-12 (CYANOCOBALAMIN) 100 MCG tablet Take 50 mcg by mouth daily.        Marland Kitchen  VITAMIN D, ERGOCALCIFEROL, PO Take by mouth.        . Zinc Sulfate (ZINC 15 PO) Take by mouth.        . sertraline (ZOLOFT) 50 MG tablet         Past history family history social history reviewed in the electronic medical record.      Objective:   Physical Exam BP 110/78  Pulse 99  Temp(Src) 97.4 F (36.3 C) (Oral)  Ht 5' 4.5" (1.638 m)  Wt 95 lb (43.092 kg)  BMI 16.05 kg/m2  SpO2 95% Physical Exam: Vital signs reviewed [id number] is a well-developed well-nourished alert cooperative  white female who appears her stated age in no acute distress. Slender  HEENT: normocephalic atraumatic , Eyes:   conjunctiva clear, Nares: paten,t no deformity discharge or tenderness., Ears: no deformity EAC's clear TMs with normal landmarks. Mouth: clear OP, no lesions, edema.  Moist mucous membranes. Dentition in adequate repair. NECK: supple without masses, thyromegaly or bruits. CHEST/PULM:  Clear to auscultation and percussion breath sounds equal no wheeze , rales or rhonchi. No chest wall deformities or tenderness. CV: PMI is nondisplaced, S1 S2 no gallops, murmurs, rubs. Peripheral pulses are full without delay.No JVD .  ABDOMEN: Bowel sounds normal nontender  No guard or rebound, no hepato splenomegal no CVA tenderness.  Extremtities:  No clubbing cyanosis or edema, though a changes left hand shows pinky trigger finger as well as middle finger NEURO:  Oriented x3, cranial nerves 3-12 appear to be intact, no obvious focal weakness,SKIN: No acute rashes normal turgor, color, no bruising or petechiae. Oriented x 3 and no noted deficits in memory, attention, and speech. PSYCH: Oriented, good eye contact, no obvious depression anxiety, cognition and judgment appear normal. LN: no cervicaladenopathy     Assessment & Plan:  Preventive Health Care Counseled regarding healthy nutrition, exercise, sleep, injury prevention, calcium vit d and healthy weight .  zostavax recommend this she can check with her insurance about cost DJD  with 2 fingers on her left hand trigger burning decreasing use Anxiety  seems to be stable on medication we discussed decreased at this time we'll continue. Recent widowed. Appears to be coping appropriately. Tobacco minor amounts but continuing. Weight loss appears to be unintentional without any focal symptoms. We'll get a chest x-ray and lab work today on follow could be related to her husband's death. She does not appear particularly depressed. Osteoporosis history not interested in medication.

## 2012-03-07 NOTE — Patient Instructions (Addendum)
Will notify you  of labs when available. Will contact our about referral to hand specialist. Check in to shingles vaccine and call for immuniz  appt only  When convenient.  We need to follow   Weight  To make sure weight loss is not from a disease problem.

## 2012-03-09 ENCOUNTER — Ambulatory Visit (INDEPENDENT_AMBULATORY_CARE_PROVIDER_SITE_OTHER)
Admission: RE | Admit: 2012-03-09 | Discharge: 2012-03-09 | Disposition: A | Discharge status: Home / Self Care | Payer: Medicare Other | Source: Ambulatory Visit | Attending: Internal Medicine | Admitting: Internal Medicine

## 2012-03-09 DIAGNOSIS — F17200 Nicotine dependence, unspecified, uncomplicated: Secondary | ICD-10-CM

## 2012-03-09 DIAGNOSIS — M1990 Unspecified osteoarthritis, unspecified site: Secondary | ICD-10-CM

## 2012-03-09 DIAGNOSIS — F411 Generalized anxiety disorder: Secondary | ICD-10-CM

## 2012-03-09 DIAGNOSIS — M810 Age-related osteoporosis without current pathological fracture: Secondary | ICD-10-CM

## 2012-03-09 DIAGNOSIS — R269 Unspecified abnormalities of gait and mobility: Secondary | ICD-10-CM

## 2012-03-09 DIAGNOSIS — M4800 Spinal stenosis, site unspecified: Secondary | ICD-10-CM

## 2012-03-09 DIAGNOSIS — R634 Abnormal weight loss: Secondary | ICD-10-CM

## 2012-03-09 DIAGNOSIS — Z0000 Encounter for general adult medical examination without abnormal findings: Secondary | ICD-10-CM

## 2012-03-09 DIAGNOSIS — M6530 Trigger finger, unspecified finger: Secondary | ICD-10-CM

## 2012-03-15 ENCOUNTER — Telehealth: Payer: Self-pay | Admitting: Gastroenterology

## 2012-03-15 DIAGNOSIS — R9389 Abnormal findings on diagnostic imaging of other specified body structures: Secondary | ICD-10-CM

## 2012-03-15 NOTE — Telephone Encounter (Signed)
Message copied by Bernita Buffy on Wed Mar 15, 2012  9:11 AM ------      Message from: [facility], [place]      Created: Wed Mar 15, 2012  8:01 AM       Inform pt that xray shows probably emphysema  . Also possible calcified lymph node which can be normal but radiologist suggests compare to previous x ray or get a chest ct scan.        Since there must not be one in the system to compare   I suggest we get a chest ct  To settle the issue .             May we order Chest CT? ( if so please schedule) dx "abnormal chest x ray"            Blood tests are good .

## 2012-03-15 NOTE — Telephone Encounter (Signed)
Patient informed of results of labs and chest xray. Per Dr Fabian Sharp appt made for patient to have a Chest CT and [place] CT on Tues. June 18th @ 11:00 am.

## 2012-03-21 ENCOUNTER — Ambulatory Visit (INDEPENDENT_AMBULATORY_CARE_PROVIDER_SITE_OTHER)
Admission: RE | Admit: 2012-03-21 | Discharge: 2012-03-21 | Disposition: A | Discharge status: Home / Self Care | Payer: Medicare Other | Source: Ambulatory Visit | Attending: Internal Medicine | Admitting: Internal Medicine

## 2012-03-21 DIAGNOSIS — R9389 Abnormal findings on diagnostic imaging of other specified body structures: Secondary | ICD-10-CM

## 2012-03-21 DIAGNOSIS — R918 Other nonspecific abnormal finding of lung field: Secondary | ICD-10-CM

## 2012-03-21 MED ORDER — IOHEXOL 300 MG/ML  SOLN
80.0000 mL | Freq: Once | INTRAMUSCULAR | Status: AC | PRN
  Administered 2012-03-21: 80 mL via INTRAVENOUS

## 2012-03-24 ENCOUNTER — Other Ambulatory Visit: Payer: Self-pay | Admitting: Family Medicine

## 2012-03-24 DIAGNOSIS — R911 Solitary pulmonary nodule: Secondary | ICD-10-CM

## 2012-03-24 DIAGNOSIS — R9389 Abnormal findings on diagnostic imaging of other specified body structures: Secondary | ICD-10-CM

## 2012-03-27 ENCOUNTER — Telehealth: Payer: Self-pay | Admitting: Pulmonary Disease

## 2012-03-27 NOTE — Telephone Encounter (Signed)
Breanna Dillon returned our call Breanna Dillon

## 2012-03-27 NOTE — Telephone Encounter (Signed)
lmomtcb x1 for Breanna Dillon

## 2012-03-27 NOTE — Telephone Encounter (Signed)
I spoke with Breanna Dillon and Dr. Fabian Sharp is wanting pt to be seen July 1 if possible as new consult for possible lung cancer. She is in the process now in getting her PET scan authorized. I have scheduled pt to see RA on 04/07/12 at 1:30. Pt needs to arrive 15 minutes early to fill out paperwork. Breanna Dillon is aware of the apt. Nothing further was needed

## 2012-03-28 ENCOUNTER — Institutional Professional Consult (permissible substitution): Payer: Medicare Other | Admitting: Pulmonary Disease

## 2012-04-04 ENCOUNTER — Encounter (HOSPITAL_COMMUNITY): Payer: Self-pay

## 2012-04-04 ENCOUNTER — Ambulatory Visit (HOSPITAL_COMMUNITY)
Admission: RE | Admit: 2012-04-04 | Discharge: 2012-04-04 | Disposition: A | Discharge status: Home / Self Care | Payer: Medicare Other | Source: Ambulatory Visit | Attending: Internal Medicine | Admitting: Internal Medicine

## 2012-04-04 DIAGNOSIS — I700 Atherosclerosis of aorta: Secondary | ICD-10-CM | POA: Insufficient documentation

## 2012-04-04 DIAGNOSIS — R9389 Abnormal findings on diagnostic imaging of other specified body structures: Secondary | ICD-10-CM

## 2012-04-04 DIAGNOSIS — R911 Solitary pulmonary nodule: Secondary | ICD-10-CM | POA: Insufficient documentation

## 2012-04-04 MED ORDER — FLUDEOXYGLUCOSE F - 18 (FDG) INJECTION
17.7000 | Freq: Once | INTRAVENOUS | Status: AC | PRN
  Administered 2012-04-04: 17.7 via INTRAVENOUS

## 2012-04-07 ENCOUNTER — Ambulatory Visit (INDEPENDENT_AMBULATORY_CARE_PROVIDER_SITE_OTHER): Payer: Medicare Other | Admitting: Pulmonary Disease

## 2012-04-07 ENCOUNTER — Encounter: Payer: Self-pay | Admitting: Pulmonary Disease

## 2012-04-07 VITALS — BP 126/72 | HR 74 | Temp 98.6°F | Ht 65.0 in | Wt 96.8 lb

## 2012-04-07 DIAGNOSIS — J449 Chronic obstructive pulmonary disease, unspecified: Secondary | ICD-10-CM | POA: Insufficient documentation

## 2012-04-07 DIAGNOSIS — R911 Solitary pulmonary nodule: Secondary | ICD-10-CM

## 2012-04-07 NOTE — Patient Instructions (Addendum)
Breathing test We discussed biopsy & surgical options for the spot in your lung I will call Bill with the best approach - You must try to quit smoking Use a nicotine patch daily

## 2012-04-07 NOTE — Progress Notes (Signed)
Subjective:    Patient ID: Breanna Dillon, female    DOB: 1929-11-11, 78 y.o.   MRN: [id number]  HPI PCP - Panosh Son-in law- Dr. Karleen Hampshire [phone] (c), [phone] (o), [phone] (h)  78 y.o heavy smoker referred for evaluation of abnormal imaging showing RLL nodule. She is coping after husbands death suddenly. 10/18/22  From a blood clot.  She went in for a  preventive visit and follow-up of trigger finger on her left fifth pinky and middle finger making it hard to grip. She reported some unintended  weight loss over the last 6 months unintended she's just not as hungry see usual but no nausea vomiting bleeding syncope.  Chest xray showed Partially calcified oval lesion in the right infrahilar region. Ct chest 03/21/12 showed a tiny subpleural nodule in the left upper lobe which measures 3.3 mm. Within the right upper lobe there was a 2.5 mm nodule. There was a large well circumscribed pulmonary nodule in the right lower lobe which  measured 2.2 x 1.8 cm. On PET there was some low level hypermetabolic activity (SUVmax = 4.3). No other sites of hypermetabolic activity identified within the neck, chest, abdomen or pelvis were noted. She is now smoking 2-5 cigs per day     Past Medical History  Diagnosis Date  . Spinal stenosis     mild on back MRI, see eval 2007 and 2008 for r leg gait problem ? dyskinesia, MRI chronic changes  . Right bundle branch block   . Pain in thoracic spine   . Abnormality of gait   . Anxiety   . Pain in joint, lower leg   . Unspecified glaucoma   . Migraine without aura, without mention of intractable migraine without mention of status migrainosus   . Osteoporosis, unspecified   . Palpitations     Past Surgical History  Procedure Date  . Tonsillectomy   . Cataract extraction     bilateral   Allergies  Allergen Reactions  . Codeine Phosphate     REACTION: unspecified    History   Social History  . Marital Status: Married    Spouse Name: N/A      Number of Children: 3  . Years of Education: N/A   Occupational History  . retired    Social History Main Topics  . Smoking status: Current Everyday Smoker -- 50 years    Types: Cigarettes  . Smokeless tobacco: Not on file   Comment: 2-5 cigs a day--smoked off and on  . Alcohol Use: Yes     occ  . Drug Use: No  . Sexually Active: Not on file   Other Topics Concern  . Not on file   Social History Narrative   Lives in [place] Previously worked at [employer] in campus [employer] travel to [place] with illness cancer dx and recent hospitalization with SBo and PNA         Review of Systems  Constitutional: Positive for appetite change and unexpected weight change. Negative for fever.  HENT: Negative for ear pain, congestion, sore throat, rhinorrhea, trouble swallowing and dental problem.   Respiratory: Positive for cough.   Cardiovascular: Negative for chest pain, palpitations and leg swelling.  Gastrointestinal: Negative for abdominal pain.  Musculoskeletal: Negative for joint swelling.  Skin: Negative for rash.  Neurological: Negative for headaches.  Psychiatric/Behavioral: Negative for dysphoric mood. The patient is not nervous/anxious.        Objective:   Physical  Exam  Gen. Pleasant, thin woman, in no distress, normal affect ENT - no lesions, no post nasal drip Neck: No JVD, no thyromegaly, no carotid bruits Lungs: no use of accessory muscles, no dullness to percussion, clear without rales or rhonchi  Cardiovascular: Rhythm regular, heart sounds  normal, no murmurs or gallops, no peripheral edema Abdomen: soft and non-tender, no hepatosplenomegaly, BS normal. Musculoskeletal: No deformities, no cyanosis or clubbing Neuro:  alert, non focal       Assessment & Plan:

## 2012-04-10 DIAGNOSIS — R911 Solitary pulmonary nodule: Secondary | ICD-10-CM | POA: Insufficient documentation

## 2012-04-10 NOTE — Assessment & Plan Note (Signed)
Even thoguh PET is indeterminate , pre test probability for malignancy is high in this heavy smoker. Unfortunately no prior imaging is available We discussed biopsy & surgical options -The various options of biopsy including guided bronchoscopy, CT guided needle aspiration and surgical biopsy were discussed.The risks of each procedure including coughing, bleeding and the  chances of lung puncture requiring chest tube were discussed in great detail. The benefits & alternatives including serial follow up were also discussed. If PFTs adequate, we could possibly proceed with surgical resection directly. Given advanced age & her poor nutritional status, it may be prudent to biopsy this lesion first She will also need cardiac risk stratification prior to surgery most likely. Discussed with pt & son inlaw x 30 mins

## 2012-04-10 NOTE — Assessment & Plan Note (Signed)
You must try to quit smoking Use a nicotine patch daily PFTs to assess lung function

## 2012-04-11 ENCOUNTER — Ambulatory Visit (HOSPITAL_COMMUNITY)
Admission: RE | Admit: 2012-04-11 | Discharge: 2012-04-11 | Disposition: A | Discharge status: Home / Self Care | Payer: Medicare Other | Source: Ambulatory Visit | Attending: Pulmonary Disease | Admitting: Pulmonary Disease

## 2012-04-11 DIAGNOSIS — J4489 Other specified chronic obstructive pulmonary disease: Secondary | ICD-10-CM | POA: Insufficient documentation

## 2012-04-11 DIAGNOSIS — J449 Chronic obstructive pulmonary disease, unspecified: Secondary | ICD-10-CM | POA: Insufficient documentation

## 2012-04-11 LAB — PULMONARY FUNCTION TEST

## 2012-04-11 MED ORDER — ALBUTEROL SULFATE (5 MG/ML) 0.5% IN NEBU
2.5000 mg | INHALATION_SOLUTION | Freq: Once | RESPIRATORY_TRACT | Status: AC
  Administered 2012-04-11: 2.5 mg via RESPIRATORY_TRACT

## 2012-04-13 ENCOUNTER — Telehealth: Payer: Self-pay | Admitting: Pulmonary Disease

## 2012-04-13 DIAGNOSIS — R911 Solitary pulmonary nodule: Secondary | ICD-10-CM

## 2012-04-13 NOTE — Telephone Encounter (Signed)
I discussed PFt results & multi disc discussion with pts son in law. Will proceed with ENB under general anesthesia. Will need cardiology clearance prior for this biopsy procedure & possible lobectomy if needed eventually. I note that she has seen dr taylor recently - will fwd this to him to see if she needs stress testing prior to biopsy.  ALVA,RAKESH V.

## 2012-04-14 NOTE — Telephone Encounter (Signed)
enb will be scheduled for 05/03/12 Tobe Sos

## 2012-04-18 ENCOUNTER — Ambulatory Visit (INDEPENDENT_AMBULATORY_CARE_PROVIDER_SITE_OTHER): Payer: Medicare Other | Admitting: Internal Medicine

## 2012-04-18 ENCOUNTER — Encounter: Payer: Self-pay | Admitting: Internal Medicine

## 2012-04-18 VITALS — BP 120/72 | HR 71 | Ht 64.0 in | Wt 96.0 lb

## 2012-04-18 DIAGNOSIS — J449 Chronic obstructive pulmonary disease, unspecified: Secondary | ICD-10-CM

## 2012-04-18 DIAGNOSIS — R002 Palpitations: Secondary | ICD-10-CM

## 2012-04-18 NOTE — Patient Instructions (Addendum)
Your physician wants you to follow-up in: 12 months with Dr. Taylor. You will receive a reminder letter in the mail two months in advance. If you don't receive a letter, please call our office to schedule the follow-up appointment.    

## 2012-04-19 ENCOUNTER — Encounter: Payer: Self-pay | Admitting: Internal Medicine

## 2012-04-19 ENCOUNTER — Other Ambulatory Visit: Payer: Self-pay | Admitting: Pulmonary Disease

## 2012-04-19 NOTE — Progress Notes (Signed)
HPI Breanna Dillon returns today for followup. She is a pleasant 78 yo woman with a h/o palpitations, tobacco abuse and anxiety. She has been stable over the past year from a palpitation perspective. She has been unable to stop smoking. She denies chest pain. She has chronic stable dyspnea, class 2, and arthritis pain. Allergies  Allergen Reactions  . Codeine Phosphate Nausea And Vomiting    Reaction occurred 30 years ago and may no longer be valid, per patient.     Current Outpatient Prescriptions  Medication Sig Dispense Refill  . travoprost, benzalkonium, (TRAVATAN Z) 0.004 % ophthalmic solution Place 1 drop into both eyes at bedtime.       . vitamin B-12 (CYANOCOBALAMIN) 100 MCG tablet Take 50 mcg by mouth daily.        Marland Kitchen aspirin 325 MG tablet Take 325 mg by mouth daily.      . beta carotene w/minerals (OCUVITE) tablet Take 1 tablet by mouth daily.      Marland Kitchen CALCIUM CARBONATE PO Take 1 tablet by mouth daily.      . Multiple Vitamin (MULTIVITAMIN WITH MINERALS) TABS Take 1 tablet by mouth daily.      . sertraline (ZOLOFT) 50 MG tablet Take 50 mg by mouth daily.      Marland Kitchen VITAMIN D, CHOLECALCIFEROL, PO Take 1 capsule by mouth daily.         Past Medical History  Diagnosis Date  . Spinal stenosis     mild on back MRI, see eval 2007 and 2008 for r leg gait problem ? dyskinesia, MRI chronic changes  . Right bundle branch block   . Pain in thoracic spine   . Abnormality of gait   . Anxiety   . Pain in joint, lower leg   . Unspecified glaucoma   . Migraine without aura, without mention of intractable migraine without mention of status migrainosus   . Osteoporosis, unspecified   . Palpitations     ROS:   All systems reviewed and negative except as noted in the HPI.   Past Surgical History  Procedure Date  . Tonsillectomy   . Cataract extraction     bilateral     Family History  Problem Relation Age of Onset  . Osteoarthritis Mother   . Kidney cancer Father      History    Social History  . Marital Status: Widowed    Spouse Name: N/A    Number of Children: 3  . Years of Education: N/A   Occupational History  . retired    Social History Main Topics  . Smoking status: Former Smoker -- 50 years    Types: Cigarettes  . Smokeless tobacco: Not on file   Comment: 2-5 cigs a day--smoked off and on  . Alcohol Use: Yes     occ  . Drug Use: No  . Sexually Active: Not on file   Other Topics Concern  . Not on file   Social History Narrative   [place] with spousePreviously worked at [employer] in campus [employer] travel to [place] with illness cancer dx and recent hospitalization with SBo and PNA     BP 120/72  Pulse 71  Ht 5\' 4"  (1.626 m)  Wt 96 lb (43.545 kg)  BMI 16.48 kg/m2  Physical Exam:  Elderly, frail appearing NAD HEENT: Unremarkable Neck:  No JVD, no thyromegally Lungs:  Clear with no wheezes HEART:  Regular rate rhythm, no murmurs, no rubs, no clicks Abd:  soft, positive bowel sounds, no organomegally, no rebound, no guarding Ext:  2 plus pulses, no edema, no cyanosis, no clubbing Skin:  No rashes no nodules Neuro:  CN II through XII intact, motor grossly intact   Assess/Plan:

## 2012-04-19 NOTE — Assessment & Plan Note (Signed)
Her symptoms are improved. She will undergo watchful waiting and will call if the symptoms worsen.

## 2012-04-19 NOTE — Assessment & Plan Note (Signed)
Her dyspnea is at baseline. She is undergoing evaluation for a pulmonary nodule. From my perspective, proceeding with bronchoscopy would be reasonable.

## 2012-04-20 ENCOUNTER — Encounter (HOSPITAL_COMMUNITY): Payer: Self-pay

## 2012-04-20 ENCOUNTER — Encounter (HOSPITAL_COMMUNITY)
Admission: RE | Admit: 2012-04-20 | Discharge: 2012-04-20 | Disposition: A | Discharge status: Home / Self Care | Payer: Medicare Other | Source: Ambulatory Visit | Attending: Pulmonary Disease | Admitting: Pulmonary Disease

## 2012-04-20 LAB — BASIC METABOLIC PANEL
Calcium: 9.7 mg/dL (ref 8.4–10.5)
Chloride: 105 mEq/L (ref 96–112)
Creatinine, Ser: 1.08 mg/dL (ref 0.50–1.10)
GFR calc Af Amer: 55 mL/min — ABNORMAL LOW (ref >90)

## 2012-04-20 LAB — SURGICAL PCR SCREEN
MRSA, PCR: NEGATIVE
Staphylococcus aureus: NEGATIVE

## 2012-04-20 LAB — CBC
Platelets: 208 10*3/uL (ref 150–400)
RDW: 13.5 % (ref 11.5–15.5)
WBC: 7 10*3/uL (ref 4.0–10.5)

## 2012-04-20 NOTE — Pre-Procedure Instructions (Signed)
Breanna Dillon   04/20/2012   Your procedure is scheduled on: July 31st, Wednesday   Report to [facility] Stay [facility] at 6:30 AM.   Call this number if you have problems the morning of surgery: [phone]   Remember:   Do not eat food or drink any fluids/liquids:After Midnight Tuesday.   Take these medicines the morning of surgery with A SIP OF WATER: Zoloft   Do not wear jewelry, make-up or nail polish.  Do not wear lotions, powders, or perfumes.    Do not shave 48 hours prior to surgery. Men may shave face and neck.   Do not bring valuables to the hospital.  Contacts, dentures or bridgework may not be worn into surgery.   Leave suitcase in the car. After surgery it may be brought to your room.  For patients admitted to the hospital, checkout time is 11:00 AM the day of discharge.   Patients discharged the day of surgery will not be allowed to drive home.  Name and phone number of your driver:    Special Instructions: CHG Shower Use Special Wash: 1/2 bottle night before surgery and 1/2 bottle morning of surgery.   Please read over the following fact sheets that you were given: Pain Booklet, Coughing and Deep Breathing, MRSA Information and Surgical Site Infection Prevention

## 2012-04-20 NOTE — Progress Notes (Addendum)
12:30PM..Marland KitchenI spoke with Onalee Hua @ [facility]..the patient did NOT have ekg when she saw Dr Ladona Ridgel this past week.Marland KitchenMarland KitchenWill do one DOS..I have already ordered this as a future order in the computer.  DA

## 2012-04-21 NOTE — Consult Note (Signed)
Anesthesia Chart Review:  Patient is a 78 year old female scheduled for bronchoscopy with biopsy on 04/26/12 by Dr. Vassie Loll.  History includes former smoker, right BBB, glaucoma, anxiety, osteoporosis, migraines, spinal stenosis, gait abnormality, palpitations.  PCP is Dr. Fabian Sharp.  Son-in-law is Dr. Karleen Hampshire.  She is scheduled to see Dr. Vassie Loll pre-operatively on 04/24/12.  She has been evaluated by Cardiologist Dr. Ladona Ridgel for "spells" of weakness, wobbling and palpitations in the setting of a right BBB.  He did not recommend any specific testing at that time.  He last saw her on 04/18/12 and felt that from his standpoint, "proeeding with bronchoscopy would be reasonable."  EKG on 02/26/11 showed NSR, right BBB.  Since this is > 1 year ago, she will need a repeat EKG on arrival.   Echo on 03/11/11 showed: - Left ventricle: The cavity size was normal. Wall thickness was increased in a pattern of mild LVH. Systolic function was normal. The estimated ejection fraction was in the range of 55% to 60%. Doppler parameters are consistent with abnormal left ventricular relaxation (grade 1 diastolic dysfunction). - Aortic valve: Mild regurgitation. - Mitral valve: Trivial regurgitation. -Tricuspid valve: Trivial regurgitation.  CXR on 03/09/12 showed: Emphysema. No acute abnormality. Partially calcified oval lesion in the right infrahilar region is of uncertain etiology, possibly a lymph node.   Chest CT on 03/21/12 showed: 1. No acute cardiopulmonary abnormalities.  2. Pulmonary nodule in the right lower lobe measures 2.2 cm. Cannot rule out small pulmonary neoplasm.   PET scan on 04/04/12 showed: 1. Smoothly marginated 2.2 x 1.7 cm right lower lobe pulmonary nodule demonstrates hypermetabolic activity (SUVmax = 4.3), concerning for potential neoplasm, and may either be a primary lesion or metastatic. Consideration for biopsy or surgical resection is recommended.  2. No other sites of hypermetabolic activity  identified within the neck, chest, abdomen or pelvis to suggest additional sites of disease.  3. Atherosclerosis.   PFTs on 04/11/12 showed FVC 1.89 (72%), FEV1 1.31 (67%).  Labs (CBC, BMET) from 04/20/12 were noted.  If no significant change in her status then anticipate she can proceed as planned.  She will be evaluated by her Anesthesiologist on the day of surgery.  Shonna Chock, PA-C

## 2012-04-24 ENCOUNTER — Encounter: Payer: Self-pay | Admitting: Pulmonary Disease

## 2012-04-24 ENCOUNTER — Ambulatory Visit (INDEPENDENT_AMBULATORY_CARE_PROVIDER_SITE_OTHER): Payer: Medicare Other | Admitting: Pulmonary Disease

## 2012-04-24 VITALS — BP 108/70 | HR 76 | Temp 98.5°F | Ht 64.0 in | Wt 94.4 lb

## 2012-04-24 DIAGNOSIS — J449 Chronic obstructive pulmonary disease, unspecified: Secondary | ICD-10-CM

## 2012-04-24 DIAGNOSIS — R911 Solitary pulmonary nodule: Secondary | ICD-10-CM

## 2012-04-24 NOTE — Patient Instructions (Signed)
Biopsy planned for 31st DO not take aspirin the night before

## 2012-04-24 NOTE — Assessment & Plan Note (Signed)
The various options of biopsy including bronchoscopy, CT guided needle aspiration and surgical biopsy were discussed.The risks of each procedure including coughing, bleeding and the  chances of lung puncture requiring chest tube were discussed in great detail. The benefits & alternatives including serial follow up were also discussed. Plan is for navigation bronchoscopy under general anesthesia on 7/31

## 2012-04-24 NOTE — Assessment & Plan Note (Signed)
6/13 fev1 75%, DLCO 38% May be poor surgical candidate due to low DLCO.  No meds for now since asymptomatic Would benefit from pulm rehab but she is not interested

## 2012-04-24 NOTE — Progress Notes (Signed)
  Subjective:    Patient ID: Breanna Dillon, female    DOB: 12/12/1929, 78 y.o.   MRN: [id number]  HPI PCP - Panosh  Son-in law- Dr. William McGough [phone]), [phone] (o), [phone] (h)   78 y.o heavy smoker for FU of RLL nodule.  She is coping after husbands death suddenly. Jan '13 From a blood clot.  She went in for a preventive visit and follow-up of trigger finger on her left fifth pinky and middle finger making it hard to grip. She reported some unintended weight loss over the last 6 months  Chest xray showed Partially calcified oval lesion in the right infrahilar region.  Ct chest 03/21/12 showed a tiny subpleural nodule in the left upper lobe which measures 3.3 mm. Within the right upper lobe there was a 2.5 mm nodule. There was a large well circumscribed pulmonary nodule in the right lower lobe which measured 2.2 x 1.8 cm.  On PET there was some low level hypermetabolic activity (SUVmax = 4.3). No other sites of hypermetabolic activity identified within the neck, chest, abdomen or pelvis were noted.  She has quit smoking since her initial visit 6/13 PFTs showed surprisingly preserved FEV1 at 75% post BD but DLCO was decreased at 38%      Review of Systems  Patient denies significant dyspnea,cough, hemoptysis,  chest pain, palpitations, pedal edema, orthopnea, paroxysmal nocturnal dyspnea, lightheadedness, nausea, vomiting, abdominal or  leg pains      Objective:   Physical Exam  Gen. Pleasant, thin woman, in no distress ENT - no lesions, no post nasal drip Neck: No JVD, no thyromegaly, no carotid bruits Lungs: no use of accessory muscles, no dullness to percussion, decreased without rales or rhonchi  Cardiovascular: Rhythm regular, heart sounds  normal, no murmurs or gallops, no peripheral edema Musculoskeletal: No deformities, no cyanosis or clubbing        Assessment & Plan:   

## 2012-05-03 ENCOUNTER — Ambulatory Visit (HOSPITAL_COMMUNITY): Payer: Medicare Other

## 2012-05-03 ENCOUNTER — Ambulatory Visit (HOSPITAL_COMMUNITY): Payer: Medicare Other | Admitting: Vascular Surgery

## 2012-05-03 ENCOUNTER — Encounter (HOSPITAL_COMMUNITY)
Admission: RE | Disposition: A | Discharge status: Home / Self Care | Payer: Self-pay | Source: Ambulatory Visit | Attending: Pulmonary Disease

## 2012-05-03 ENCOUNTER — Encounter (HOSPITAL_COMMUNITY): Payer: Self-pay | Admitting: Vascular Surgery

## 2012-05-03 ENCOUNTER — Encounter (HOSPITAL_COMMUNITY): Payer: Self-pay | Admitting: Certified Registered Nurse Anesthetist

## 2012-05-03 ENCOUNTER — Encounter (HOSPITAL_COMMUNITY): Payer: Self-pay | Admitting: *Deleted

## 2012-05-03 ENCOUNTER — Ambulatory Visit (HOSPITAL_COMMUNITY)
Admission: RE | Admit: 2012-05-03 | Discharge: 2012-05-03 | Disposition: A | Discharge status: Home / Self Care | Payer: Medicare Other | Source: Ambulatory Visit | Attending: Pulmonary Disease | Admitting: Pulmonary Disease

## 2012-05-03 DIAGNOSIS — F411 Generalized anxiety disorder: Secondary | ICD-10-CM | POA: Insufficient documentation

## 2012-05-03 DIAGNOSIS — F17200 Nicotine dependence, unspecified, uncomplicated: Secondary | ICD-10-CM | POA: Insufficient documentation

## 2012-05-03 DIAGNOSIS — R51 Headache: Secondary | ICD-10-CM | POA: Insufficient documentation

## 2012-05-03 DIAGNOSIS — R911 Solitary pulmonary nodule: Secondary | ICD-10-CM

## 2012-05-03 DIAGNOSIS — Z01812 Encounter for preprocedural laboratory examination: Secondary | ICD-10-CM | POA: Insufficient documentation

## 2012-05-03 DIAGNOSIS — J449 Chronic obstructive pulmonary disease, unspecified: Secondary | ICD-10-CM | POA: Insufficient documentation

## 2012-05-03 DIAGNOSIS — J4489 Other specified chronic obstructive pulmonary disease: Secondary | ICD-10-CM

## 2012-05-03 HISTORY — PX: ELECTROMAGNETIC NAVIGATION BROCHOSCOPY: SHX5369

## 2012-05-03 SURGERY — ELECTROMAGNETIC NAVIGATION BRONCHOSCOPY
Anesthesia: General | Site: Chest | Laterality: Right | Wound class: Clean Contaminated

## 2012-05-03 MED ORDER — GLYCOPYRROLATE 0.2 MG/ML IJ SOLN
INTRAMUSCULAR | Status: DC | PRN
  Administered 2012-05-03: .4 mg via INTRAVENOUS

## 2012-05-03 MED ORDER — FENTANYL CITRATE 0.05 MG/ML IJ SOLN
INTRAMUSCULAR | Status: DC | PRN
  Administered 2012-05-03 (×2): 50 ug via INTRAVENOUS

## 2012-05-03 MED ORDER — LIDOCAINE HCL (CARDIAC) 20 MG/ML IV SOLN
INTRAVENOUS | Status: DC | PRN
  Administered 2012-05-03: 100 mg via INTRAVENOUS

## 2012-05-03 MED ORDER — EPHEDRINE SULFATE 50 MG/ML IJ SOLN
INTRAMUSCULAR | Status: DC | PRN
  Administered 2012-05-03 (×3): 5 mg via INTRAVENOUS

## 2012-05-03 MED ORDER — NEOSTIGMINE METHYLSULFATE 1 MG/ML IJ SOLN
INTRAMUSCULAR | Status: DC | PRN
  Administered 2012-05-03: 3 mg via INTRAVENOUS

## 2012-05-03 MED ORDER — ONDANSETRON HCL 4 MG/2ML IJ SOLN
INTRAMUSCULAR | Status: DC | PRN
  Administered 2012-05-03: 4 mg via INTRAVENOUS

## 2012-05-03 MED ORDER — LIDOCAINE HCL 4 % MT SOLN
OROMUCOSAL | Status: DC | PRN
  Administered 2012-05-03: 4 mL via TOPICAL

## 2012-05-03 MED ORDER — PROPOFOL 10 MG/ML IV EMUL
INTRAVENOUS | Status: DC | PRN
  Administered 2012-05-03: 160 mg via INTRAVENOUS
  Administered 2012-05-03: 40 mg via INTRAVENOUS

## 2012-05-03 MED ORDER — LACTATED RINGERS IV SOLN
INTRAVENOUS | Status: DC | PRN
  Administered 2012-05-03 (×2): via INTRAVENOUS

## 2012-05-03 MED ORDER — FENTANYL CITRATE 0.05 MG/ML IJ SOLN: 25.0000 ug | INTRAMUSCULAR | Status: DC | PRN

## 2012-05-03 MED ORDER — ROCURONIUM BROMIDE 100 MG/10ML IV SOLN
INTRAVENOUS | Status: DC | PRN
  Administered 2012-05-03: 50 mg via INTRAVENOUS

## 2012-05-03 MED ORDER — 0.9 % SODIUM CHLORIDE (POUR BTL) OPTIME
TOPICAL | Status: DC | PRN
  Administered 2012-05-03: 1000 mL

## 2012-05-03 MED ORDER — MIDAZOLAM HCL 5 MG/5ML IJ SOLN
INTRAMUSCULAR | Status: DC | PRN
  Administered 2012-05-03: 1 mg via INTRAVENOUS

## 2012-05-03 SURGICAL SUPPLY — 33 items
BRUSH SUPERTRAX BIOPSY (INSTRUMENTS) ×2 IMPLANT
BRUSH SUPERTRAX NDL-TIP CYTO (INSTRUMENTS) ×2 IMPLANT
CANISTER SUCTION 2500CC (MISCELLANEOUS) ×2 IMPLANT
CHANNEL WORK EXTEND EDGE 180 (KITS) IMPLANT
CHANNEL WORK EXTEND EDGE 45 (KITS) IMPLANT
CHANNEL WORK EXTEND EDGE 90 (KITS) IMPLANT
CLOTH BEACON ORANGE TIMEOUT ST (SAFETY) ×2 IMPLANT
CONT SPEC 4OZ CLIKSEAL STRL BL (MISCELLANEOUS) ×2 IMPLANT
COVER TABLE BACK 60X90 (DRAPES) ×2 IMPLANT
FILTER STRAW FLUID ASPIR (MISCELLANEOUS) IMPLANT
FORCEPS BIOP SUPERTRX PREMAR (INSTRUMENTS) ×2 IMPLANT
GLOVE BIOGEL PI IND STRL 6.5 (GLOVE) ×1 IMPLANT
GLOVE BIOGEL PI INDICATOR 6.5 (GLOVE) ×1
GLOVE SURG SIGNA 7.5 PF LTX (GLOVE) ×2 IMPLANT
KIT LOCATABLE GUIDE (CANNULA) IMPLANT
KIT MARKER FIDUCIAL DELIVERY (KITS) IMPLANT
KIT PROCEDURE EDGE 180 (KITS) IMPLANT
KIT PROCEDURE EDGE 45 (KITS) IMPLANT
KIT PROCEDURE EDGE 90 (KITS) ×2 IMPLANT
KIT ROOM TURNOVER OR (KITS) ×2 IMPLANT
MARKER SKIN DUAL TIP RULER LAB (MISCELLANEOUS) ×2 IMPLANT
NEEDLE SUPERTRX PREMARK BIOPSY (NEEDLE) ×2 IMPLANT
NS IRRIG 1000ML POUR BTL (IV SOLUTION) ×2 IMPLANT
OIL SILICONE PENTAX (PARTS (SERVICE/REPAIRS)) ×2 IMPLANT
PAD ARMBOARD 7.5X6 YLW CONV (MISCELLANEOUS) ×4 IMPLANT
PATCHES PATIENT (LABEL) ×2 IMPLANT
SPONGE GAUZE 4X4 12PLY (GAUZE/BANDAGES/DRESSINGS) ×2 IMPLANT
SYR 20CC LL (SYRINGE) ×2 IMPLANT
SYR 20ML ECCENTRIC (SYRINGE) ×2 IMPLANT
SYR 30ML LL (SYRINGE) ×2 IMPLANT
TOWEL OR 17X24 6PK STRL BLUE (TOWEL DISPOSABLE) ×2 IMPLANT
TRAP SPECIMEN MUCOUS 40CC (MISCELLANEOUS) ×2 IMPLANT
TUBE CONNECTING 12X1/4 (SUCTIONS) ×2 IMPLANT

## 2012-05-03 NOTE — H&P (View-Only) (Signed)
  Subjective:    Patient ID: Breanna Dillon, female    DOB: 09/15/1930, 78 y.o.   MRN: [id number]  HPI PCP - Panosh  Son-in law- Dr. Karleen Hampshire [phone] (c), [phone] (o), [phone] (h)   78 y.o heavy smoker for FU of RLL nodule.  She is coping after husbands death suddenly. 10-26-2022 From a blood clot.  She went in for a preventive visit and follow-up of trigger finger on her left fifth pinky and middle finger making it hard to grip. She reported some unintended weight loss over the last 6 months  Chest xray showed Partially calcified oval lesion in the right infrahilar region.  Ct chest 03/21/12 showed a tiny subpleural nodule in the left upper lobe which measures 3.3 mm. Within the right upper lobe there was a 2.5 mm nodule. There was a large well circumscribed pulmonary nodule in the right lower lobe which measured 2.2 x 1.8 cm.  On PET there was some low level hypermetabolic activity (SUVmax = 4.3). No other sites of hypermetabolic activity identified within the neck, chest, abdomen or pelvis were noted.  She has quit smoking since her initial visit 6/13 PFTs showed surprisingly preserved FEV1 at 75% post BD but DLCO was decreased at 38%      Review of Systems  Patient denies significant dyspnea,cough, hemoptysis,  chest pain, palpitations, pedal edema, orthopnea, paroxysmal nocturnal dyspnea, lightheadedness, nausea, vomiting, abdominal or  leg pains      Objective:   Physical Exam  Gen. Pleasant, thin woman, in no distress ENT - no lesions, no post nasal drip Neck: No JVD, no thyromegaly, no carotid bruits Lungs: no use of accessory muscles, no dullness to percussion, decreased without rales or rhonchi  Cardiovascular: Rhythm regular, heart sounds  normal, no murmurs or gallops, no peripheral edema Musculoskeletal: No deformities, no cyanosis or clubbing        Assessment & Plan:

## 2012-05-03 NOTE — Op Note (Signed)
Video Bronchoscopy with Electromagnetic Navigation Procedure Note  Date of Operation: 05/03/2012  Pre-op Diagnosis: RLL nodule, smoker  Post-op Diagnosis: same  Surgeon: [facility] ALVA  Assistants: none  Anesthesia: General endotracheal anesthesia  Operation: Flexible video fiberoptic bronchoscopy with electromagnetic navigation and biopsies.  Estimated Blood Loss: Minimal  Complications: none  Indications and History: Breanna Dillon is a 78 y.o. female with RLL 2 cm nodule.  The risks, benefits, complications, treatment options and expected outcomes were discussed with the patient.  The possibilities of pneumothorax, pneumonia, reaction to medication, pulmonary aspiration, perforation of a viscus, bleeding, failure to diagnose a condition and creating a complication requiring transfusion or operation were discussed with the patient who freely signed the consent.    Description of Procedure: The patient was seen in the Preoperative Area, was examined and was deemed appropriate to proceed.  The patient was taken to holding area, identified as Melven Sartorius and the procedure verified as Flexible Video Fiberoptic Bronchoscopy.  A Time Out was held and the above information confirmed.   Prior to the date of the procedure a high-resolution CT scan of the chest was performed. Utilizing superDimension software a virtual tracheobronchial tree was generated to allow the creation of distinct navigation pathways to the patient's RLL nodule. After being taken to the operating room general anesthesia was initiated and the patient  was orally intubated. The video fiberoptic bronchoscope was introduced via the endotracheal tube and a general inspection was performed which showed minimal secretions. The extendable working channel and locator guide were introduced into the bronchoscope. The distinct navigation pathways prepared prior to this procedure were then utilized to navigate to within 2 cm of  patient's lesion(s) identified on CT scan. The extendable working channel was secured into place and the locator guide was withdrawn. Under fluoroscopic guidance transbronchial needle brushings, transbronchial Wang needle biopsies, and transbronchial forceps biopsies x4 were performed to be sent for cytology and pathology. A bronchioalveolar lavage was performed in the RLL and sent for cytology and microbiology (bacterial, fungal, AFB smears and cultures). At the end of the procedure a general airway inspection was performed and there was no evidence of active bleeding. The bronchoscope was removed.  The patient tolerated the procedure well. There was no significant blood loss and there were no obvious complications. A post-procedural chest x-ray is pending.  Samples: 1. Transbronchial needle brushings from RLL 2. Transbronchial Wang needle biopsies from RLL 3. Transbronchial forceps biopsies from RLL 4. Bronchoalveolar lavage from RLL   Plans:  The patient will be discharged from the PACU to home when recovered from anesthesia and after chest x-ray is reviewed. We will review the cytology, pathology and microbiology results with the patient when they become available. Outpatient followup will be with me.    ALVA,RAKESH V. . 05/03/2012

## 2012-05-03 NOTE — Anesthesia Preprocedure Evaluation (Addendum)
Anesthesia Evaluation  Patient identified by MRN, date of birth, ID band Patient awake    Reviewed: Allergy & Precautions, H&P , NPO status , Patient's Chart, lab work & pertinent test results  Airway Mallampati: II      Dental   Pulmonary COPD breath sounds clear to auscultation        Cardiovascular + dysrhythmias Rhythm:Regular Rate:Normal     Neuro/Psych  Headaches, Anxiety    GI/Hepatic Neg liver ROS,   Endo/Other  negative endocrine ROS  Renal/GU negative Renal ROS     Musculoskeletal   Abdominal   Peds  Hematology negative hematology ROS (+)   Anesthesia Other Findings   Reproductive/Obstetrics                         Anesthesia Physical Anesthesia Plan  ASA: III  Anesthesia Plan: General   Post-op Pain Management:    Induction: Intravenous  Airway Management Planned: Oral ETT  Additional Equipment:   Intra-op Plan:   Post-operative Plan: Possible Post-op intubation/ventilation  Informed Consent: I have reviewed the patients History and Physical, chart, labs and discussed the procedure including the risks, benefits and alternatives for the proposed anesthesia with the patient or authorized representative who has indicated his/her understanding and acceptance.     Plan Discussed with: Anesthesiologist, CRNA and Surgeon  Anesthesia Plan Comments:        Anesthesia Quick Evaluation

## 2012-05-03 NOTE — Transfer of Care (Signed)
Immediate Anesthesia Transfer of Care Note  Patient: Breanna Dillon  Procedure(s) Performed: Procedure(s) (LRB): ELECTROMAGNETIC NAVIGATION BRONCHOSCOPY (N/A)  Patient Location: PACU  Anesthesia Type: General  Level of Consciousness: awake  Airway & Oxygen Therapy: Patient Spontanous Breathing and Patient connected to nasal cannula oxygen  Post-op Assessment: Report given to PACU RN and Post -op Vital signs reviewed and stable  Post vital signs: Reviewed and stable  Complications: No apparent anesthesia complications

## 2012-05-03 NOTE — Preoperative (Signed)
Beta Blockers   Reason not to administer Beta Blockers:Not Applicable 

## 2012-05-03 NOTE — Anesthesia Postprocedure Evaluation (Signed)
  Anesthesia Post-op Note  Patient: Breanna Dillon  Procedure(s) Performed: Procedure(s) (LRB): ELECTROMAGNETIC NAVIGATION BRONCHOSCOPY (Right)  Patient Location: PACU  Anesthesia Type: General  Level of Consciousness: awake  Airway and Oxygen Therapy: Patient Spontanous Breathing  Post-op Pain: mild  Post-op Assessment: Post-op Vital signs reviewed  Post-op Vital Signs: Reviewed  Complications: No apparent anesthesia complications

## 2012-05-03 NOTE — Brief Op Note (Signed)
Navigated to 2 cm of lesion, brushings, needle aspiration, BAL & biopsies x4 obtained. CXR pending.  Celene Pippins V.

## 2012-05-03 NOTE — Interval H&P Note (Signed)
No interval changes. ENB bronchoscopy planned today for RLL nodule The risks of each procedure including coughing, bleeding and the  chances of lung puncture requiring chest tube were discussed in great detail. The benefits & alternatives were also discussed.

## 2012-05-03 NOTE — Anesthesia Procedure Notes (Signed)
Procedure Name: Intubation Date/Time: 05/03/2012 8:55 AM Performed by: Margaree Mackintosh Pre-anesthesia Checklist: Patient identified, Timeout performed, Emergency Drugs available, Suction available and Patient being monitored Patient Re-evaluated:Patient Re-evaluated prior to inductionOxygen Delivery Method: Circle system utilized Preoxygenation: Pre-oxygenation with 100% oxygen Intubation Type: IV induction Ventilation: Mask ventilation without difficulty Laryngoscope Size: Mac and 3 Grade View: Grade I Tube type: Oral Tube size: 8.5 mm Number of attempts: 1 Airway Equipment and Method: Stylet and LTA kit utilized Placement Confirmation: ETT inserted through vocal cords under direct vision,  positive ETCO2 and breath sounds checked- equal and bilateral Secured at: 22 cm Tube secured with: Tape Dental Injury: Teeth and Oropharynx as per pre-operative assessment

## 2012-05-04 ENCOUNTER — Encounter (HOSPITAL_COMMUNITY): Payer: Self-pay | Admitting: Pulmonary Disease

## 2012-05-05 ENCOUNTER — Other Ambulatory Visit: Payer: Self-pay | Admitting: Dermatology

## 2012-05-10 ENCOUNTER — Encounter: Payer: Self-pay | Admitting: Pulmonary Disease

## 2012-05-10 ENCOUNTER — Ambulatory Visit (INDEPENDENT_AMBULATORY_CARE_PROVIDER_SITE_OTHER): Payer: Medicare Other | Admitting: Pulmonary Disease

## 2012-05-10 VITALS — BP 112/78 | HR 87 | Temp 97.2°F | Ht 64.0 in | Wt 99.4 lb

## 2012-05-10 DIAGNOSIS — R911 Solitary pulmonary nodule: Secondary | ICD-10-CM

## 2012-05-10 DIAGNOSIS — J449 Chronic obstructive pulmonary disease, unspecified: Secondary | ICD-10-CM

## 2012-05-10 DIAGNOSIS — J4489 Other specified chronic obstructive pulmonary disease: Secondary | ICD-10-CM

## 2012-05-10 NOTE — Assessment & Plan Note (Signed)
Biopsies & washings all neg, I was able to navigate within 2 cm of lesion RPt CT in 3 months & FU - I am still concerned about malignancy

## 2012-05-10 NOTE — Progress Notes (Signed)
  Subjective:    Patient ID: Breanna Dillon, female    DOB: 08-Sep-1930, 78 y.o.   MRN: [id number]  HPI PCP - Panosh  Son-in law- Dr. Karleen Hampshire [phone] (c), [phone] (o), [phone] (h)    78 y.o heavy smoker for FU of RLL nodule.  She is coping after husbands death suddenly. Nov 05, 2022 From a blood clot.  She went in for a preventive visit and follow-up of trigger finger on her left fifth pinky and middle finger making it hard to grip. She reported some unintended weight loss over the last 6 months  Chest xray showed Partially calcified oval lesion in the right infrahilar region.  Ct chest 03/21/12 showed a tiny subpleural nodule in the left upper lobe which measures 3.3 mm. Within the right upper lobe there was a 2.5 mm nodule. There was a large well circumscribed pulmonary nodule in the right lower lobe which measured 2.2 x 1.8 cm.  On PET there was some low level hypermetabolic activity (SUVmax = 4.3). No other sites of hypermetabolic activity identified within the neck, chest, abdomen or pelvis were noted.  PFTs showed surprisingly preserved FEV1 at 75% post BD but DLCO was decreased at 38%  ENB biopsies neg, able to navigate within 2 cm of lesion CAT score 11 - has quit smoking 6/13      Review of Systems neg for any significant sore throat, dysphagia, itching, sneezing, nasal congestion or excess/ purulent secretions, fever, chills, sweats, unintended wt loss, pleuritic or exertional cp, hempoptysis, orthopnea pnd or change in chronic leg swelling. Also denies presyncope, palpitations, heartburn, abdominal pain, nausea, vomiting, diarrhea or change in bowel or urinary habits, dysuria,hematuria, rash, arthralgias, visual complaints, headache, numbness weakness or ataxia.     Objective:   Physical Exam  Gen. Pleasant, well-nourished, in no distress ENT - no lesions, no post nasal drip Neck: No JVD, no thyromegaly, no carotid bruits Lungs: no use of accessory muscles, no dullness  to percussion, clear without rales or rhonchi  Cardiovascular: Rhythm regular, heart sounds  normal, no murmurs or gallops, no peripheral edema Musculoskeletal: No deformities, no cyanosis or clubbing        Assessment & Plan:

## 2012-05-10 NOTE — Patient Instructions (Signed)
Rpt CT in 3 months

## 2012-05-10 NOTE — Assessment & Plan Note (Signed)
WOuld be a good candidate for rehab but she is not interested OK to proceed with surgery for trigger finger if needed

## 2012-06-14 ENCOUNTER — Telehealth: Payer: Self-pay | Admitting: *Deleted

## 2012-06-14 NOTE — Telephone Encounter (Signed)
Discussed biopsy results with son-in law, Dr Regino Schultze PL arrange for OV in 3 mnths after CT chest with contrast for RLL mass   Pt ct is scheduled for 08/07/12 and her appt with RA is on 08/08/12. Pt is aware of this

## 2012-07-06 ENCOUNTER — Ambulatory Visit: Payer: Medicare Other | Attending: Neurology | Admitting: Physical Therapy

## 2012-07-06 ENCOUNTER — Ambulatory Visit: Payer: Medicare Other | Admitting: Occupational Therapy

## 2012-07-06 DIAGNOSIS — Z5189 Encounter for other specified aftercare: Secondary | ICD-10-CM | POA: Insufficient documentation

## 2012-07-06 DIAGNOSIS — R269 Unspecified abnormalities of gait and mobility: Secondary | ICD-10-CM | POA: Insufficient documentation

## 2012-07-06 DIAGNOSIS — M25649 Stiffness of unspecified hand, not elsewhere classified: Secondary | ICD-10-CM | POA: Insufficient documentation

## 2012-07-06 DIAGNOSIS — R279 Unspecified lack of coordination: Secondary | ICD-10-CM | POA: Insufficient documentation

## 2012-07-06 DIAGNOSIS — M6281 Muscle weakness (generalized): Secondary | ICD-10-CM | POA: Insufficient documentation

## 2012-07-10 ENCOUNTER — Ambulatory Visit: Payer: Medicare Other | Admitting: Occupational Therapy

## 2012-07-13 ENCOUNTER — Ambulatory Visit: Payer: Medicare Other | Admitting: *Deleted

## 2012-07-14 ENCOUNTER — Ambulatory Visit (INDEPENDENT_AMBULATORY_CARE_PROVIDER_SITE_OTHER): Payer: Medicare Other | Admitting: Family Medicine

## 2012-07-14 VITALS — BP 158/73 | HR 93 | Temp 98.4°F | Resp 16 | Ht 64.5 in | Wt 99.6 lb

## 2012-07-14 DIAGNOSIS — S50819A Abrasion of unspecified forearm, initial encounter: Secondary | ICD-10-CM

## 2012-07-14 DIAGNOSIS — IMO0002 Reserved for concepts with insufficient information to code with codable children: Secondary | ICD-10-CM

## 2012-07-14 DIAGNOSIS — Z23 Encounter for immunization: Secondary | ICD-10-CM

## 2012-07-14 DIAGNOSIS — S41109A Unspecified open wound of unspecified upper arm, initial encounter: Secondary | ICD-10-CM

## 2012-07-14 MED ORDER — AMOXICILLIN-POT CLAVULANATE 875-125 MG PO TABS: 1.0000 | ORAL_TABLET | Freq: Two times a day (BID) | ORAL | Status: DC

## 2012-07-14 NOTE — Progress Notes (Signed)
Subjective: 78 year old lady who presents to the office with a cat induced injury from during the night last night. Apparently the cat usually stays outside. The cat was in the house last night and on her bed. When he jumped off it injured her right forearm. She had a bracelet on, and she was not sure whether the bracelet toward the skin or the cats claw did. She apparently bled profusely. The cat has had rabies vaccines. The patient is not quite sure when her last shots were however, since she was the cat belonging to the patient's deceased husband. The patient herself is not certain when she last tetanus shot. We will try try to check with Dr. Riki Altes and find out what her last shot was  Objective: Large area of ecchymosis on the right forearm from the wrist into about 6 inches above the wrist. Over the radial head there is a 2 CM skin tear which is closed. She had had 3 or 4  puncture marks there also. There is a large hematoma. There is no real erythema.  Assessment wound right forearm with hematoma, superficial laceration, and puncture wounds. This does not appear to be a tear from the bracelet, I believe that the clothes did.  Plan Do to the extensiveness of the hematoma surrounding the puncture wounds I'm going ahead and place her on Augmentin twice daily. She will give DTaP.  Return for

## 2012-07-14 NOTE — Patient Instructions (Addendum)
Tetanus vaccine (tdap) given today.  Ices 3-4 times daily for a couple of days for 10 or 15 minutes  Take the antibiotic twice daily  Return if worse.

## 2012-07-19 ENCOUNTER — Ambulatory Visit: Payer: Medicare Other | Admitting: Occupational Therapy

## 2012-07-19 ENCOUNTER — Ambulatory Visit: Payer: Medicare Other | Admitting: *Deleted

## 2012-07-21 ENCOUNTER — Ambulatory Visit: Payer: Medicare Other | Admitting: *Deleted

## 2012-07-25 ENCOUNTER — Ambulatory Visit: Payer: Medicare Other | Admitting: Occupational Therapy

## 2012-07-25 ENCOUNTER — Ambulatory Visit: Payer: Medicare Other | Admitting: Physical Therapy

## 2012-07-27 ENCOUNTER — Ambulatory Visit: Payer: Medicare Other | Admitting: Occupational Therapy

## 2012-07-27 ENCOUNTER — Ambulatory Visit: Payer: Medicare Other | Admitting: Physical Therapy

## 2012-08-01 ENCOUNTER — Encounter: Payer: Medicare Other | Admitting: Occupational Therapy

## 2012-08-01 ENCOUNTER — Ambulatory Visit: Payer: Medicare Other | Admitting: Physical Therapy

## 2012-08-03 ENCOUNTER — Ambulatory Visit: Payer: Medicare Other | Admitting: Occupational Therapy

## 2012-08-03 ENCOUNTER — Ambulatory Visit: Payer: Medicare Other | Admitting: Physical Therapy

## 2012-08-07 ENCOUNTER — Ambulatory Visit (INDEPENDENT_AMBULATORY_CARE_PROVIDER_SITE_OTHER)
Admission: RE | Admit: 2012-08-07 | Discharge: 2012-08-07 | Disposition: A | Discharge status: Home / Self Care | Payer: Medicare Other | Source: Ambulatory Visit | Attending: Pulmonary Disease | Admitting: Pulmonary Disease

## 2012-08-07 DIAGNOSIS — R911 Solitary pulmonary nodule: Secondary | ICD-10-CM

## 2012-08-08 ENCOUNTER — Ambulatory Visit (INDEPENDENT_AMBULATORY_CARE_PROVIDER_SITE_OTHER): Payer: Medicare Other | Admitting: Pulmonary Disease

## 2012-08-08 ENCOUNTER — Encounter: Payer: Self-pay | Admitting: Pulmonary Disease

## 2012-08-08 VITALS — BP 126/74 | HR 69 | Temp 98.9°F | Ht 65.0 in | Wt 102.0 lb

## 2012-08-08 DIAGNOSIS — R911 Solitary pulmonary nodule: Secondary | ICD-10-CM

## 2012-08-08 DIAGNOSIS — Z23 Encounter for immunization: Secondary | ICD-10-CM

## 2012-08-08 NOTE — Patient Instructions (Addendum)
Repeat CT scan in 4 months & FU We discussed surgery for this nodule Can discuss with dr Dohmeier after MRI

## 2012-08-08 NOTE — Progress Notes (Signed)
  Subjective:    Patient ID: Breanna Dillon, female    DOB: [birth year], 78 y.o.   MRN: [id number]  HPI PCP - Panosh  Son-in law- Dr. Karleen Hampshire [phone] (c), [phone] (o), [phone] (h)   79 y.o heavy smoker for FU of RLL nodule.  She is coping after husbands death suddenly. Oct 29, 2022 From a blood clot.  She went in for a preventive visit and follow-up of trigger finger on her left fifth pinky and middle finger making it hard to grip. She reported some unintended weight loss over the last 6 months  Chest xray showed Partially calcified oval lesion in the right infrahilar region.  Ct chest 03/21/12 showed a tiny subpleural nodule in the left upper lobe which measures 3.3 mm. Within the right upper lobe there was a 2.5 mm nodule. There was a large well circumscribed pulmonary nodule in the right lower lobe which measured 2.2 x 1.8 cm.  On PET there was some low level hypermetabolic activity (SUVmax = 4.3). No other sites of hypermetabolic activity identified within the neck, chest, abdomen or pelvis were noted.  PFTs showed surprisingly preserved FEV1 at 75% post BD but DLCO was decreased at 38%  ENB biopsies neg, able to navigate within 2 cm of lesion  CAT score 11 - has quit smoking 6/13  08/08/2012  pt states breathing has been good since last OV.Evaluated by Neurology (dohmeier) for worsening RUE weakness - Recent EMG and MRI brain to r/o Neuropathy...patient has buldging cervical disc. Pt has been having difficulty with balance and muscle weakness-having multiple falls. Pt would like to proceed with surgery on Left hand. Patient would like flu vax and shingles rx  CT reviewed  - unchanged size of RLL nodule    Review of Systems neg for any significant sore throat, dysphagia, itching, sneezing, nasal congestion or excess/ purulent secretions, fever, chills, sweats, unintended wt loss, pleuritic or exertional cp, hempoptysis, orthopnea pnd or change in chronic leg swelling. Also denies  presyncope, palpitations, heartburn, abdominal pain, nausea, vomiting, diarrhea or change in bowel or urinary habits, dysuria,hematuria, rash, arthralgias, visual complaints, headache, numbness weakness or ataxia.     Objective:   Physical Exam  Gen. Pleasant, well-nourished, in no distress ENT - no lesions, no post nasal drip Neck: No JVD, no thyromegaly, no carotid bruits Lungs: no use of accessory muscles, no dullness to percussion, clear without rales or rhonchi  Cardiovascular: Rhythm regular, heart sounds  normal, no murmurs or gallops, no peripheral edema Musculoskeletal: No deformities, no cyanosis or clubbing        Assessment & Plan:

## 2012-08-08 NOTE — Assessment & Plan Note (Addendum)
Repeat CT scan in 4 months & FU We discussed surgery for this nodule - we discussed that malignancy remains likely inspite of negative biopsy results, I offered CT surgery evaluation but she would like to hold off & get her arm strength improved first Can discuss with dr Dohmeier after MRI - RUE weakness is unexplained & paraneoplastic syndrome is being considered, rehab may be an option

## 2012-08-09 ENCOUNTER — Ambulatory Visit: Payer: Medicare Other | Attending: Neurology | Admitting: *Deleted

## 2012-08-09 ENCOUNTER — Encounter: Payer: Medicare Other | Admitting: Occupational Therapy

## 2012-08-09 DIAGNOSIS — M6281 Muscle weakness (generalized): Secondary | ICD-10-CM | POA: Insufficient documentation

## 2012-08-09 DIAGNOSIS — R269 Unspecified abnormalities of gait and mobility: Secondary | ICD-10-CM | POA: Insufficient documentation

## 2012-08-09 DIAGNOSIS — Z5189 Encounter for other specified aftercare: Secondary | ICD-10-CM | POA: Insufficient documentation

## 2012-08-09 DIAGNOSIS — M25649 Stiffness of unspecified hand, not elsewhere classified: Secondary | ICD-10-CM | POA: Insufficient documentation

## 2012-08-09 DIAGNOSIS — R279 Unspecified lack of coordination: Secondary | ICD-10-CM | POA: Insufficient documentation

## 2012-08-10 ENCOUNTER — Encounter: Payer: Medicare Other | Admitting: Occupational Therapy

## 2012-08-10 ENCOUNTER — Ambulatory Visit: Payer: Medicare Other | Admitting: Physical Therapy

## 2012-08-10 ENCOUNTER — Other Ambulatory Visit: Payer: Self-pay | Admitting: Internal Medicine

## 2012-08-21 ENCOUNTER — Ambulatory Visit: Payer: Medicare Other | Admitting: Physical Therapy

## 2012-08-21 ENCOUNTER — Telehealth: Payer: Self-pay | Admitting: Family Medicine

## 2012-08-21 NOTE — Telephone Encounter (Signed)
The pt called and left a message on my voicemail.  She said she needed a new rx.  Wanted to see if [facility] would prescribe it for her.  Not sure what the medication is.   That was unclear on the message.  Tried calling her back.  Unable to leave a message.  No answering machine at home number.  Will try again at a later time.

## 2012-08-22 NOTE — Telephone Encounter (Signed)
Patient would like a new rx for Chantix.  Please advise.  Thanks!

## 2012-08-22 NOTE — Telephone Encounter (Signed)
Ok to rx starter pack for chantix and 2 continuation packs  Pt should disc potential side effects with pharmacist .

## 2012-08-23 ENCOUNTER — Other Ambulatory Visit: Payer: Self-pay | Admitting: Internal Medicine

## 2012-08-23 MED ORDER — VARENICLINE TARTRATE 0.5 MG X 11 & 1 MG X 42 PO MISC: ORAL | Status: DC

## 2012-08-23 MED ORDER — VARENICLINE TARTRATE 1 MG PO TABS: 1.0000 mg | ORAL_TABLET | Freq: Two times a day (BID) | ORAL | Status: DC

## 2012-08-23 NOTE — Telephone Encounter (Signed)
Rx called to the pharmacy and left on voicemail.  Pt notified to speak to the pharmacist about drug related problems.

## 2012-08-24 ENCOUNTER — Ambulatory Visit: Payer: Medicare Other | Admitting: Physical Therapy

## 2012-08-28 ENCOUNTER — Ambulatory Visit: Payer: Medicare Other | Admitting: Physical Therapy

## 2012-08-29 ENCOUNTER — Ambulatory Visit: Payer: Medicare Other | Admitting: Physical Therapy

## 2012-10-15 ENCOUNTER — Other Ambulatory Visit: Payer: Self-pay | Admitting: Internal Medicine

## 2012-10-16 ENCOUNTER — Encounter: Payer: Self-pay | Admitting: Internal Medicine

## 2012-10-16 ENCOUNTER — Ambulatory Visit (INDEPENDENT_AMBULATORY_CARE_PROVIDER_SITE_OTHER): Payer: Medicare Other | Admitting: Internal Medicine

## 2012-10-16 VITALS — BP 120/74 | HR 92 | Wt 100.0 lb

## 2012-10-16 DIAGNOSIS — M4800 Spinal stenosis, site unspecified: Secondary | ICD-10-CM

## 2012-10-16 DIAGNOSIS — M21949 Unspecified acquired deformity of hand, unspecified hand: Secondary | ICD-10-CM

## 2012-10-16 DIAGNOSIS — M1990 Unspecified osteoarthritis, unspecified site: Secondary | ICD-10-CM

## 2012-10-16 DIAGNOSIS — F329 Major depressive disorder, single episode, unspecified: Secondary | ICD-10-CM

## 2012-10-16 DIAGNOSIS — F341 Dysthymic disorder: Secondary | ICD-10-CM

## 2012-10-16 DIAGNOSIS — R911 Solitary pulmonary nodule: Secondary | ICD-10-CM

## 2012-10-16 DIAGNOSIS — M21939 Unspecified acquired deformity of unspecified forearm: Secondary | ICD-10-CM

## 2012-10-16 DIAGNOSIS — J449 Chronic obstructive pulmonary disease, unspecified: Secondary | ICD-10-CM

## 2012-10-16 DIAGNOSIS — F17200 Nicotine dependence, unspecified, uncomplicated: Secondary | ICD-10-CM

## 2012-10-16 NOTE — Progress Notes (Signed)
Chief Complaint  Patient presents with  . Follow-up    multipl issues to discuss    HPI: Patient comes in today for follow up of  multiple medical problems.  Has seen mutple specialist relateing to medical problems      pulm nodules followed by pulmonary negative biopsy and PET scan but concerned that this still could be malignancy following at this time as opposed to surgery.   Hand ;left hand  show progressive deformity and climbing over the last 6 months or so has seen a hand specialist   Sypher.    Offered  Only cosmetic  Surgery .   Old injury   fall when younger  Hs of rigger finders and now   Deformed left hand  was told to see neurologist and she saw Dr. Hulda Humphrey who did 2 MRIs of the for neurologic cause of her deformity. Found non- No pain but stiffer and  Getting worse . Has had ncs and brace   Hard to get it on. Saw a hand therapist. But cannot fit hand in splint  Still has difficulty with her right leg and gait a bit unsteady no change  Continues to smoke an occasional tobacco thinking of going on Chantix but decided to wait because of above issues. May try again contact us if there are side effects that she's worried about  She states the prednisone helped her right hand where her handwriting had gotten difficult in her hands stay off ask why she can just not be on prednisone most of the time. She's never seen a rheumatologist presumed diagnosis of degenerative arthritis rule out paraneoplastic old trauma problem with the left upper extremity. Review of neurologic notes x1 showed ataxia as the diagnosis.  Patient comes in to ask about all of these to maintain on the sertraline.    ROS: See pertinent positives and negatives per HPI. No recent falling has gained some weight no chest pain no current coughing  Past Medical History  Diagnosis Date  . Spinal stenosis     mild on back MRI, see eval 2007 and 2008 for r leg gait problem ? dyskinesia, MRI chronic changes  .  Right bundle branch block   . Pain in thoracic spine   . Abnormality of gait   . Anxiety   . Pain in joint, lower leg   . Unspecified glaucoma(365.9)   . Migraine without aura, without mention of intractable migraine without mention of status migrainosus   . Osteoporosis, unspecified   . Palpitations     Family History  Problem Relation Age of Onset  . Osteoarthritis Mother   . Kidney cancer Father     History   Social History  . Marital Status: Widowed    Spouse Name: N/A    Number of Children: 3  . Years of Education: N/A   Occupational History  . retired    Social History Main Topics  . Smoking status: Former Smoker -- 50 years    Types: Cigarettes  . Smokeless tobacco: Never Used     Comment: 2-5 cigs a day--smoked off and on  . Alcohol Use: Yes     Comment: occ  . Drug Use: No  . Sexually Active: None   Other Topics Concern  . None   Social History Narrative   Lives in [place] widowed Previously worked at [employer] in campus [id number] with illness cancer dx and recent hospitalization with SBo and PNAdeceased     Outpatient  Encounter Prescriptions as of 10/16/2012  Medication Sig Dispense Refill  . aspirin 325 MG tablet Take 325 mg by mouth daily as needed.       . beta carotene w/minerals (OCUVITE) tablet Take 1 tablet by mouth daily.      Marland Kitchen CALCIUM CARBONATE PO Take 1 tablet by mouth daily.      . Multiple Vitamin (MULTIVITAMIN WITH MINERALS) TABS Take 1 tablet by mouth daily.      . sertraline (ZOLOFT) 50 MG tablet Take 50 mg by mouth daily.      . sertraline (ZOLOFT) 50 MG tablet       . travoprost, benzalkonium, (TRAVATAN Z) 0.004 % ophthalmic solution Place 1 drop into both eyes at bedtime.       . vitamin B-12 (CYANOCOBALAMIN) 100 MCG tablet Take 50 mcg by mouth daily.        Marland Kitchen VITAMIN D, CHOLECALCIFEROL, PO Take 1 capsule by mouth daily.      Marland Kitchen zinc gluconate 50 MG tablet Take 50 mg by mouth daily.      .  [DISCONTINUED] sertraline (ZOLOFT) 50 MG tablet TAKE 1/2 TABLET DAILY FOR 1 WEEK, THEN 1 TABLET DAILY. DO NOT TAKE ST JOHNS WORT.  30 tablet  1  . predniSONE (DELTASONE) 5 MG tablet As directed      . varenicline (CHANTIX CONTINUING MONTH PAK) 1 MG tablet Take 1 tablet (1 mg total) by mouth 2 (two) times daily.  60 tablet  1  . varenicline (CHANTIX STARTING MONTH PAK) 0.5 MG X 11 & 1 MG X 42 tablet Take one 0.5 mg tablet by mouth once daily for 3 days, then increase to one 0.5 mg tablet twice daily for 4 days, then increase to one 1 mg tablet twice daily.  53 tablet  0  . [DISCONTINUED] amoxicillin-clavulanate (AUGMENTIN) 875-125 MG per tablet Take 1 tablet by mouth 2 (two) times daily.  14 tablet  0    EXAM:  BP 120/74  Pulse 92  Wt 100 lb (45.36 kg)  SpO2 94%  There is no height on file to calculate BMI.  GENERAL: vitals reviewed and listed above, alert, oriented, appears well hydrated and in no acute distress very slender but cognitively intact.  HEENT: atraumatic, conjunctiva  clear, no obvious abnormalities on inspection of external nose and ears OP : no lesion edema or exudate   NECK: no obvious masses on inspection palpation   LUNGS: Unlabored breathing bs =  CV: HRRR, no clubbing cyanosis or  peripheral edema nl cap refill no gallops or murmurs  MS: moves all extremities  ulnar claw-type deformity left hand without redness right hand some triggering of the fingers and mild MCP enlargement.   No tremor gait is slow mildly ataxic but steady otherwise Skin no acute changes Alert Oriented x 3 and no noted deficits in memory, attention, and speech.  ASSESSMENT AND PLAN:  Discussed the following assessment and plan:  1. Hand deformities, acquired    sever eon left beginning on tight resosponded iwht increase functtion on right   2. Depressive reaction    stable on meds   3. TOBACCO USE    disc chantix  again stop altogether  4. COPD (chronic obstructive pulmonary disease)    5. DEGENERATIVE JOINT DISEASE   6. Pulmonary nodule   7. Spinal stenosis    gait sdisturbance continues   Total visit > 50% spent counseling and coordinating care  -Patient advised to return or notify health care  team  immediately if symptoms worsen or persist or new concerns arise.  Patient Instructions  I agree with  Getting a second opinion about your hand  predicament as we discussed and also consider seeing a rheumatologist as you had significant improvement on the prednisone.     Call us if needed with concern about chantix.     Neta Mends. Darrik Richman M.D.

## 2012-10-16 NOTE — Patient Instructions (Signed)
I agree with  Getting a second opinion about your hand  predicament as we discussed and also consider seeing a rheumatologist as you had significant improvement on the prednisone.     Call us if needed with concern about chantix.

## 2012-10-17 ENCOUNTER — Encounter: Payer: Self-pay | Admitting: Internal Medicine

## 2012-12-11 ENCOUNTER — Ambulatory Visit (INDEPENDENT_AMBULATORY_CARE_PROVIDER_SITE_OTHER)
Admission: RE | Admit: 2012-12-11 | Discharge: 2012-12-11 | Disposition: A | Discharge status: Home / Self Care | Payer: Medicare Other | Source: Ambulatory Visit | Attending: Pulmonary Disease | Admitting: Pulmonary Disease

## 2012-12-11 DIAGNOSIS — R911 Solitary pulmonary nodule: Secondary | ICD-10-CM

## 2012-12-20 ENCOUNTER — Other Ambulatory Visit: Payer: Self-pay | Admitting: Internal Medicine

## 2012-12-25 ENCOUNTER — Encounter: Payer: Self-pay | Admitting: Pulmonary Disease

## 2012-12-25 ENCOUNTER — Ambulatory Visit (INDEPENDENT_AMBULATORY_CARE_PROVIDER_SITE_OTHER): Payer: Medicare Other | Admitting: Pulmonary Disease

## 2012-12-25 VITALS — BP 112/68 | HR 76 | Temp 97.5°F | Ht 65.5 in | Wt 100.8 lb

## 2012-12-25 DIAGNOSIS — R911 Solitary pulmonary nodule: Secondary | ICD-10-CM

## 2012-12-25 NOTE — Assessment & Plan Note (Signed)
Nodule has remained stable, favors benign etiology Repeat CT in 6 months She can proceed with hand surgery

## 2012-12-25 NOTE — Patient Instructions (Signed)
Nodule has remained stable Repeat CT in 6months

## 2012-12-25 NOTE — Progress Notes (Signed)
  Subjective:    Patient ID: Breanna Dillon, female    DOB: 29-Jun-1930, 79 y.o.   MRN: [id number]  HPI PCP - Panosh   Son-in law- Dr. Karleen Hampshire [phone] (c), [phone] (o), [phone] (h)   79 y.o heavy smoker for FU of RLL nodule.  She is coping after husbands death suddenly. 10/26/2022 From a blood clot.  She went in for a preventive visit and follow-up of trigger finger on her left  making it hard to grip. Chest xray showed Partially calcified oval lesion in the right infrahilar region.  Ct chest 03/21/12 showed a tiny subpleural nodule in the left upper lobe which measures 3.3 mm. Within the right upper lobe there was a 2.5 mm nodule. There was a large well circumscribed pulmonary nodule in the right lower lobe which measured 2.2 x 1.8 cm.  On PET there was some low level hypermetabolic activity (SUVmax = 4.3). No other sites of hypermetabolic activity identified within the neck, chest, abdomen or pelvis were noted.  PFTs showed surprisingly preserved FEV1 at 75% post BD but DLCO was decreased at 38%  ENB biopsies neg, able to navigate within 2 cm of lesion  CAT score 11 - has quit smoking 6/13    12/25/2012 79m FU  CT reviewed 3/14  - unchanged size of RLL nodule since 6/13 pt states breathing has been good since last OV.Evaluated by Neurology (dohmeier) for worsening RUE weakness - Recent EMG and MRI brain to r/o Neuropathy...patient has bulging cervical disc. Pt has been having difficulty with balance and muscle weakness-having multiple falls.  She will need staged surgery on Left hand. She is considering acupuncture    Review of Systems neg for any significant sore throat, dysphagia, itching, sneezing, nasal congestion or excess/ purulent secretions, fever, chills, sweats, unintended wt loss, pleuritic or exertional cp, hempoptysis, orthopnea pnd or change in chronic leg swelling. Also denies presyncope, palpitations, heartburn, abdominal pain, nausea, vomiting, diarrhea or change in  bowel or urinary habits, dysuria,hematuria, rash, arthralgias, visual complaints, headache, numbness weakness or ataxia.     Objective:   Physical Exam  Gen. Pleasant, thin woman, in no distress ENT - no lesions, no post nasal drip Neck: No JVD, no thyromegaly, no carotid bruits Lungs: no use of accessory muscles, no dullness to percussion, clear without rales or rhonchi  Cardiovascular: Rhythm regular, heart sounds  normal, no murmurs or gallops, no peripheral edema Musculoskeletal: No deformities, no cyanosis or clubbing        Assessment & Plan:

## 2013-03-26 ENCOUNTER — Other Ambulatory Visit: Payer: Self-pay | Admitting: Orthopedic Surgery

## 2013-03-31 ENCOUNTER — Other Ambulatory Visit: Payer: Self-pay | Admitting: Internal Medicine

## 2013-04-03 ENCOUNTER — Encounter (HOSPITAL_BASED_OUTPATIENT_CLINIC_OR_DEPARTMENT_OTHER): Payer: Self-pay | Admitting: *Deleted

## 2013-04-03 NOTE — Progress Notes (Signed)
No labs needed-her daughter is a Engineer, civil (consulting) and will bring her

## 2013-04-10 ENCOUNTER — Ambulatory Visit (HOSPITAL_BASED_OUTPATIENT_CLINIC_OR_DEPARTMENT_OTHER): Payer: Medicare Other | Admitting: Anesthesiology

## 2013-04-10 ENCOUNTER — Encounter (HOSPITAL_BASED_OUTPATIENT_CLINIC_OR_DEPARTMENT_OTHER): Payer: Self-pay | Admitting: Anesthesiology

## 2013-04-10 ENCOUNTER — Encounter (HOSPITAL_BASED_OUTPATIENT_CLINIC_OR_DEPARTMENT_OTHER)
Admission: RE | Disposition: A | Discharge status: Home / Self Care | Payer: Self-pay | Source: Ambulatory Visit | Attending: Orthopedic Surgery

## 2013-04-10 ENCOUNTER — Encounter (HOSPITAL_BASED_OUTPATIENT_CLINIC_OR_DEPARTMENT_OTHER): Payer: Self-pay | Admitting: Orthopedic Surgery

## 2013-04-10 ENCOUNTER — Ambulatory Visit (HOSPITAL_BASED_OUTPATIENT_CLINIC_OR_DEPARTMENT_OTHER)
Admission: RE | Admit: 2013-04-10 | Discharge: 2013-04-10 | Disposition: A | Discharge status: Home / Self Care | Payer: Medicare Other | Source: Ambulatory Visit | Attending: Orthopedic Surgery | Admitting: Orthopedic Surgery

## 2013-04-10 DIAGNOSIS — F411 Generalized anxiety disorder: Secondary | ICD-10-CM | POA: Insufficient documentation

## 2013-04-10 DIAGNOSIS — S42309S Unspecified fracture of shaft of humerus, unspecified arm, sequela: Secondary | ICD-10-CM | POA: Insufficient documentation

## 2013-04-10 DIAGNOSIS — M810 Age-related osteoporosis without current pathological fracture: Secondary | ICD-10-CM | POA: Insufficient documentation

## 2013-04-10 DIAGNOSIS — R911 Solitary pulmonary nodule: Secondary | ICD-10-CM | POA: Insufficient documentation

## 2013-04-10 DIAGNOSIS — F17200 Nicotine dependence, unspecified, uncomplicated: Secondary | ICD-10-CM | POA: Insufficient documentation

## 2013-04-10 DIAGNOSIS — R002 Palpitations: Secondary | ICD-10-CM | POA: Insufficient documentation

## 2013-04-10 DIAGNOSIS — M4800 Spinal stenosis, site unspecified: Secondary | ICD-10-CM | POA: Insufficient documentation

## 2013-04-10 DIAGNOSIS — Z885 Allergy status to narcotic agent status: Secondary | ICD-10-CM | POA: Insufficient documentation

## 2013-04-10 DIAGNOSIS — Z79899 Other long term (current) drug therapy: Secondary | ICD-10-CM | POA: Insufficient documentation

## 2013-04-10 DIAGNOSIS — M24443 Recurrent dislocation, unspecified hand: Secondary | ICD-10-CM | POA: Insufficient documentation

## 2013-04-10 DIAGNOSIS — G43909 Migraine, unspecified, not intractable, without status migrainosus: Secondary | ICD-10-CM | POA: Insufficient documentation

## 2013-04-10 DIAGNOSIS — X58XXXS Exposure to other specified factors, sequela: Secondary | ICD-10-CM | POA: Insufficient documentation

## 2013-04-10 DIAGNOSIS — I4510 Unspecified right bundle-branch block: Secondary | ICD-10-CM | POA: Insufficient documentation

## 2013-04-10 DIAGNOSIS — Z7982 Long term (current) use of aspirin: Secondary | ICD-10-CM | POA: Insufficient documentation

## 2013-04-10 HISTORY — PX: REPAIR EXTENSOR TENDON: SHX5382

## 2013-04-10 SURGERY — REPAIR, TENDON, EXTENSOR
Anesthesia: General | Site: Hand | Laterality: Right | Wound class: Clean

## 2013-04-10 MED ORDER — GLYCOPYRROLATE 0.2 MG/ML IJ SOLN
INTRAMUSCULAR | Status: DC | PRN
  Administered 2013-04-10: 0.2 mg via INTRAVENOUS

## 2013-04-10 MED ORDER — LACTATED RINGERS IV SOLN
INTRAVENOUS | Status: DC
  Administered 2013-04-10 (×3): via INTRAVENOUS

## 2013-04-10 MED ORDER — PROPOFOL 10 MG/ML IV BOLUS
INTRAVENOUS | Status: DC | PRN
  Administered 2013-04-10: 80 mg via INTRAVENOUS

## 2013-04-10 MED ORDER — HYDROCODONE-ACETAMINOPHEN 5-325 MG PO TABS: 1.0000 | ORAL_TABLET | Freq: Four times a day (QID) | ORAL | Status: DC | PRN

## 2013-04-10 MED ORDER — LIDOCAINE HCL (CARDIAC) 20 MG/ML IV SOLN
INTRAVENOUS | Status: DC | PRN
  Administered 2013-04-10: 40 mg via INTRAVENOUS

## 2013-04-10 MED ORDER — CEFAZOLIN SODIUM-DEXTROSE 2-3 GM-% IV SOLR
2.0000 g | INTRAVENOUS | Status: AC
  Administered 2013-04-10: 2 g via INTRAVENOUS

## 2013-04-10 MED ORDER — ONDANSETRON HCL 4 MG/2ML IJ SOLN
INTRAMUSCULAR | Status: DC | PRN
  Administered 2013-04-10: 4 mg via INTRAVENOUS

## 2013-04-10 MED ORDER — FENTANYL CITRATE 0.05 MG/ML IJ SOLN
INTRAMUSCULAR | Status: DC | PRN
  Administered 2013-04-10: 50 ug via INTRAVENOUS
  Administered 2013-04-10: 25 ug via INTRAVENOUS

## 2013-04-10 MED ORDER — CHLORHEXIDINE GLUCONATE 4 % EX LIQD: 60.0000 mL | Freq: Once | CUTANEOUS | Status: DC

## 2013-04-10 MED ORDER — MIDAZOLAM HCL 2 MG/2ML IJ SOLN: 1.0000 mg | INTRAMUSCULAR | Status: DC | PRN

## 2013-04-10 MED ORDER — FENTANYL CITRATE 0.05 MG/ML IJ SOLN: 25.0000 ug | INTRAMUSCULAR | Status: DC | PRN

## 2013-04-10 MED ORDER — FENTANYL CITRATE 0.05 MG/ML IJ SOLN
50.0000 ug | INTRAMUSCULAR | Status: DC | PRN
Start: 2013-04-10 — End: 2013-04-10

## 2013-04-10 MED ORDER — BUPIVACAINE HCL (PF) 0.25 % IJ SOLN
INTRAMUSCULAR | Status: DC | PRN
  Administered 2013-04-10: 6 mL

## 2013-04-10 SURGICAL SUPPLY — 77 items
BAG DECANTER FOR FLEXI CONT (MISCELLANEOUS) IMPLANT
BANDAGE GAUZE ELAST BULKY 4 IN (GAUZE/BANDAGES/DRESSINGS) ×2 IMPLANT
BLADE MINI RND TIP GREEN BEAV (BLADE) ×2 IMPLANT
BLADE SURG 15 STRL LF DISP TIS (BLADE) ×1 IMPLANT
BLADE SURG 15 STRL SS (BLADE) ×1
BNDG COHESIVE 3X5 TAN STRL LF (GAUZE/BANDAGES/DRESSINGS) ×2 IMPLANT
BNDG ESMARK 4X9 LF (GAUZE/BANDAGES/DRESSINGS) ×2 IMPLANT
BNDG PLASTER X FAST 3X3 WHT LF (CAST SUPPLIES) ×20 IMPLANT
CHLORAPREP W/TINT 26ML (MISCELLANEOUS) ×2 IMPLANT
CLOTH BEACON ORANGE TIMEOUT ST (SAFETY) ×2 IMPLANT
CORDS BIPOLAR (ELECTRODE) ×2 IMPLANT
COTTONBALL LRG STERILE PKG (GAUZE/BANDAGES/DRESSINGS) IMPLANT
COVER MAYO STAND STRL (DRAPES) ×2 IMPLANT
COVER TABLE BACK 60X90 (DRAPES) ×2 IMPLANT
CUFF TOURNIQUET SINGLE 18IN (TOURNIQUET CUFF) ×2 IMPLANT
DECANTER SPIKE VIAL GLASS SM (MISCELLANEOUS) IMPLANT
DRAIN TLS ROUND 10FR (DRAIN) IMPLANT
DRAPE EXTREMITY T 121X128X90 (DRAPE) ×2 IMPLANT
DRAPE OEC MINIVIEW 54X84 (DRAPES) IMPLANT
DRAPE SURG 17X23 STRL (DRAPES) ×2 IMPLANT
DRSG KUZMA FLUFF (GAUZE/BANDAGES/DRESSINGS) IMPLANT
GAUZE SPONGE 4X4 16PLY XRAY LF (GAUZE/BANDAGES/DRESSINGS) IMPLANT
GAUZE XEROFORM 1X8 LF (GAUZE/BANDAGES/DRESSINGS) ×2 IMPLANT
GLOVE BIO SURGEON STRL SZ 6.5 (GLOVE) ×2 IMPLANT
GLOVE BIO SURGEON STRL SZ7.5 (GLOVE) ×2 IMPLANT
GLOVE BIOGEL PI IND STRL 8 (GLOVE) ×1 IMPLANT
GLOVE BIOGEL PI IND STRL 8.5 (GLOVE) ×1 IMPLANT
GLOVE BIOGEL PI INDICATOR 8 (GLOVE) ×1
GLOVE BIOGEL PI INDICATOR 8.5 (GLOVE) ×1
GLOVE SURG ORTHO 8.0 STRL STRW (GLOVE) ×2 IMPLANT
GOWN BRE IMP PREV XXLGXLNG (GOWN DISPOSABLE) ×4 IMPLANT
GOWN PREVENTION PLUS XLARGE (GOWN DISPOSABLE) ×2 IMPLANT
KWIRE 4.0 X .035IN (WIRE) IMPLANT
LOOP VESSEL MAXI BLUE (MISCELLANEOUS) IMPLANT
NEEDLE 27GAX1X1/2 (NEEDLE) ×2 IMPLANT
NEEDLE HYPO 22GX1.5 SAFETY (NEEDLE) IMPLANT
NEEDLE KEITH (NEEDLE) IMPLANT
NS IRRIG 1000ML POUR BTL (IV SOLUTION) ×2 IMPLANT
PACK BASIN DAY SURGERY FS (CUSTOM PROCEDURE TRAY) ×2 IMPLANT
PAD CAST 3X4 CTTN HI CHSV (CAST SUPPLIES) ×1 IMPLANT
PADDING CAST ABS 3INX4YD NS (CAST SUPPLIES)
PADDING CAST ABS 4INX4YD NS (CAST SUPPLIES) ×1
PADDING CAST ABS COTTON 3X4 (CAST SUPPLIES) IMPLANT
PADDING CAST ABS COTTON 4X4 ST (CAST SUPPLIES) ×1 IMPLANT
PADDING CAST COTTON 3X4 STRL (CAST SUPPLIES) ×1
SLEEVE SCD COMPRESS KNEE MED (MISCELLANEOUS) ×2 IMPLANT
SPLINT PLASTER CAST XFAST 3X15 (CAST SUPPLIES) IMPLANT
SPLINT PLASTER XTRA FASTSET 3X (CAST SUPPLIES)
SPONGE GAUZE 4X4 12PLY (GAUZE/BANDAGES/DRESSINGS) ×2 IMPLANT
STOCKINETTE 4X48 STRL (DRAPES) ×2 IMPLANT
SUT CHROMIC 5 0 P 3 (SUTURE) IMPLANT
SUT ETHIBOND 3-0 V-5 (SUTURE) IMPLANT
SUT FIBERWIRE 2-0 18 17.9 3/8 (SUTURE)
SUT FIBERWIRE 4-0 18 TAPR NDL (SUTURE)
SUT MERSILENE 2.0 SH NDLE (SUTURE) IMPLANT
SUT MERSILENE 3 0 FS 1 (SUTURE) IMPLANT
SUT MERSILENE 4 0 P 3 (SUTURE) IMPLANT
SUT POLY BUTTON 15MM (SUTURE) IMPLANT
SUT PROLENE 2 0 SH DA (SUTURE) IMPLANT
SUT SILK 2 0 FS (SUTURE) IMPLANT
SUT SILK 4 0 PS 2 (SUTURE) IMPLANT
SUT STEEL 3 0 (SUTURE) IMPLANT
SUT STEEL 4 0 V 26 (SUTURE) IMPLANT
SUT VIC AB 3-0 PS1 18 (SUTURE)
SUT VIC AB 3-0 PS1 18XBRD (SUTURE) IMPLANT
SUT VIC AB 4-0 P-3 18XBRD (SUTURE) IMPLANT
SUT VIC AB 4-0 P3 18 (SUTURE)
SUT VICRYL 4-0 PS2 18IN ABS (SUTURE) IMPLANT
SUT VICRYL RAPID 5 0 P 3 (SUTURE) IMPLANT
SUT VICRYL RAPIDE 4/0 PS 2 (SUTURE) ×2 IMPLANT
SUTURE FIBERWR 2-0 18 17.9 3/8 (SUTURE) IMPLANT
SUTURE FIBERWR 4-0 18 TAPR NDL (SUTURE) IMPLANT
SYR BULB 3OZ (MISCELLANEOUS) ×2 IMPLANT
SYR CONTROL 10ML LL (SYRINGE) ×2 IMPLANT
TOWEL OR 17X24 6PK STRL BLUE (TOWEL DISPOSABLE) ×2 IMPLANT
TUBE FEEDING 5FR 15 INCH (TUBING) IMPLANT
UNDERPAD 30X30 INCONTINENT (UNDERPADS AND DIAPERS) ×2 IMPLANT

## 2013-04-10 NOTE — Transfer of Care (Signed)
Immediate Anesthesia Transfer of Care Note  Patient: Breanna Dillon  Procedure(s) Performed: Procedure(s): CENTRALIZATION EXTENSOR TENDONS RIGHT MIDDLE FINGER, RIGHT RING FINGER, RIGHT SMALL FINGER (Right)  Patient Location: PACU  Anesthesia Type:General  Level of Consciousness: awake  Airway & Oxygen Therapy: Patient Spontanous Breathing and Patient connected to face mask oxygen  Post-op Assessment: Report given to PACU RN and Post -op Vital signs reviewed and stable  Post vital signs: Reviewed and stable  Complications: No apparent anesthesia complications

## 2013-04-10 NOTE — H&P (Signed)
Breanna Dillon is a 79 year-old right-hand dominant female seen for her left wrist.  She has a significant ulnar deviation of all fingers, radial dorsal displacement of her carpus secondary to an old fracture of her distal radius which occurred in 43 or 1991.  This was treated in a cast and has settled with significant dorsal angulation, loss of radial height and inclination resulting in a radial deviated extended carpus and metacarpals with ulnar deviation of the metacarpophalangeal joints with subluxation to the ring and little fingers.  The little finger is no longer correctible to a neutral position.  She has subluxation of the extensor tendons, middle, ring and little, the index is intact and she is able to move it.  She is not complaining of any pain or discomfort. She is not complaining of major loss of function other than the position of having it caught if she uses it.She has developed subluxation of the extensor tendons to the middle, ring and little fingers on her right side  ALLERGIES:     None. MEDICATIONS:     Sertraline, Travatan.  MEDICAL HISTORY:    She has a nodule in her lung which is apparently benign. FAMILY MEDICAL HISTORY:  Negative. SOCIAL HISTORY:     She smokes an occasional cigarette, does not drink. She is retired. Widowed. REVIEW OF SYSTEMS:    Positive for weight loss, glasses, otherwise negative 14 points.  Breanna Dillon is an 79 y.o. female.   Chief Complaint: subluxation extensor tendons rt middle, ring, little HPI: see above  Past Medical History  Diagnosis Date  . Spinal stenosis     mild on back MRI, see eval 2007 and 2008 for Dillon leg gait problem ? dyskinesia, MRI chronic changes  . Right bundle branch block   . Pain in thoracic spine   . Abnormality of gait   . Anxiety   . Pain in joint, lower leg   . Unspecified glaucoma(365.9)   . Migraine without aura, without mention of intractable migraine without mention of status migrainosus   . Osteoporosis,  unspecified   . Palpitations     Past Surgical History  Procedure Laterality Date  . Tonsillectomy    . Cataract extraction      bilateral  . Eye surgery    . Electromagnetic navigation brochoscopy  05/03/2012    Procedure: ELECTROMAGNETIC NAVIGATION BRONCHOSCOPY;  Surgeon: Oretha Milch, MD;  Location: [facility] OR;  Service: Thoracic;  Laterality: Right;  Video bronchoscopy with endobronchial navigation  . Bronchoscopy      Family History  Problem Relation Age of Onset  . Osteoarthritis Mother   . Kidney cancer Father    Social History:  reports that she has been smoking Cigarettes.  She has been smoking about 0.00 packs per day for the past 50 years. She has never used smokeless tobacco. She reports that  drinks alcohol. She reports that she does not use illicit drugs.  Allergies:  Allergies  Allergen Reactions  . Codeine Phosphate Nausea And Vomiting    Reaction occurred 30 years ago and may no longer be valid, per patient.    Medications Prior to Admission  Medication Sig Dispense Refill  . aspirin 325 MG tablet Take 325 mg by mouth daily as needed.       . beta carotene w/minerals (OCUVITE) tablet Take 1 tablet by mouth daily.      Marland Kitchen CALCIUM CARBONATE PO Take 1 tablet by mouth daily.      . Multiple  Vitamin (MULTIVITAMIN WITH MINERALS) TABS Take 1 tablet by mouth daily.      . sertraline (ZOLOFT) 50 MG tablet TAKE 1/2 TAB BY MOUTH X1 WEEK, THEN 1 TABLET ONCE DAILY *DONT TAKE WITH ST JOHNS WORT*  90 tablet  0  . travoprost, benzalkonium, (TRAVATAN Z) 0.004 % ophthalmic solution Place 1 drop into both eyes at bedtime.       . vitamin B-12 (CYANOCOBALAMIN) 100 MCG tablet Take 50 mcg by mouth daily.        Marland Kitchen VITAMIN D, CHOLECALCIFEROL, PO Take 1 capsule by mouth daily.      Marland Kitchen zinc gluconate 50 MG tablet Take 50 mg by mouth daily.        No results found for this or any previous visit (from the past 48 hour(s)).  No results found.   Pertinent items are noted in HPI.  Height  5' 5.5" (1.664 m), weight 45.36 kg (100 lb).  General appearance: alert, cooperative and appears stated age Head: Normocephalic, without obvious abnormality Neck: no JVD Resp: clear to auscultation bilaterally Cardio: regular rate and rhythm, S1, S2 normal, no murmur, click, rub or gallop GI: soft, non-tender; bowel sounds normal; no masses,  no organomegaly Extremities: extremities normal, atraumatic, no cyanosis or edema Pulses: 2+ and symmetric Skin: Skin color, texture, turgor normal. No rashes or lesions Neurologic: Grossly normal Incision/Wound: na  Assessment/Plan  She would like to proceed to have these taken care of. She is scheduled for centralization extensor tendons right middle, ring and little fingers in an effort to maintain the function to her right hand. This will be scheduled as an outpatient under regional anesthesia.    Breanna Dillon 04/10/2013, 7:32 AM

## 2013-04-10 NOTE — Op Note (Signed)
Dictation Number [id number]

## 2013-04-10 NOTE — Anesthesia Preprocedure Evaluation (Signed)
Anesthesia Evaluation  Patient identified by MRN, date of birth, ID band Patient awake    Reviewed: Allergy & Precautions, H&P , NPO status , Patient's Chart, lab work & pertinent test results  Airway Mallampati: I TM Distance: >3 FB     Dental   Pulmonary COPDformer smoker,  breath sounds clear to auscultation        Cardiovascular Rate:Normal     Neuro/Psych Anxiety Depression    GI/Hepatic   Endo/Other    Renal/GU      Musculoskeletal   Abdominal   Peds  Hematology   Anesthesia Other Findings   Reproductive/Obstetrics                           Anesthesia Physical Anesthesia Plan  ASA: II  Anesthesia Plan: General   Post-op Pain Management:    Induction: Intravenous  Airway Management Planned: LMA  Additional Equipment:   Intra-op Plan:   Post-operative Plan: Extubation in OR  Informed Consent: I have reviewed the patients History and Physical, chart, labs and discussed the procedure including the risks, benefits and alternatives for the proposed anesthesia with the patient or authorized representative who has indicated his/her understanding and acceptance.     Plan Discussed with: CRNA and Surgeon  Anesthesia Plan Comments:         Anesthesia Quick Evaluation

## 2013-04-10 NOTE — Anesthesia Postprocedure Evaluation (Signed)
  Anesthesia Post-op Note  Patient: Breanna Dillon  Procedure(s) Performed: Procedure(s): CENTRALIZATION EXTENSOR TENDONS RIGHT MIDDLE FINGER, RIGHT RING FINGER, RIGHT SMALL FINGER (Right)  Patient Location: PACU  Anesthesia Type:General  Level of Consciousness: awake  Airway and Oxygen Therapy: Patient Spontanous Breathing  Post-op Pain: mild  Post-op Assessment: Post-op Vital signs reviewed, Patient's Cardiovascular Status Stable, Respiratory Function Stable, Patent Airway, No signs of Nausea or vomiting and Pain level controlled  Post-op Vital Signs: stable  Complications: No apparent anesthesia complications

## 2013-04-10 NOTE — Anesthesia Procedure Notes (Signed)
Procedure Name: LMA Insertion Date/Time: 04/10/2013 9:36 AM Performed by: Gar Gibbon Pre-anesthesia Checklist: Patient identified, Emergency Drugs available, Suction available and Patient being monitored Patient Re-evaluated:Patient Re-evaluated prior to inductionOxygen Delivery Method: Circle System Utilized Preoxygenation: Pre-oxygenation with 100% oxygen Intubation Type: IV induction Ventilation: Mask ventilation without difficulty LMA: LMA inserted LMA Size: 4.0 Number of attempts: 1 Airway Equipment and Method: bite block Placement Confirmation: positive ETCO2 Tube secured with: Tape Dental Injury: Teeth and Oropharynx as per pre-operative assessment

## 2013-04-10 NOTE — Brief Op Note (Signed)
04/10/2013  10:43 AM  PATIENT:  Melven Sartorius  79 y.o. female  PRE-OPERATIVE DIAGNOSIS:  RUPTURE SAGITTAL FIBER RIGHT MIDDLE FINGER, RIGHT RING FINGER, RIGHT SMALL FINGER  POST-OPERATIVE DIAGNOSIS:  RUPTURE SAGITTAL FIBER RIGHT MIDDLE FINGER, RIGHT RING FINGER, RIGHT SMALL FINGER  PROCEDURE:  Procedure(s): CENTRALIZATION EXTENSOR TENDONS RIGHT MIDDLE FINGER, RIGHT RING FINGER, RIGHT SMALL FINGER (Right)  SURGEON:  Surgeon(s) and Role:    * Nicki Reaper, MD - Primary    * Tami Ribas, MD - Assisting  PHYSICIAN ASSISTANT:   ASSISTANTS: K Rosalynn Sergent,MD   ANESTHESIA:   general and local  EBL:   none  BLOOD ADMINISTERED:none  DRAINS: none   LOCAL MEDICATIONS USED:  MARCAINE     SPECIMEN:  No Specimen  DISPOSITION OF SPECIMEN:  N/A  COUNTS:  YES  TOURNIQUET:   Total Tourniquet Time Documented: Upper Arm (Left) - 54 minutes Total: Upper Arm (Left) - 54 minutes   DICTATION: .Other Dictation: Dictation Number [id number]  PLAN OF CARE: Discharge to home after PACU  PATIENT DISPOSITION:  PACU - hemodynamically stable.

## 2013-04-11 ENCOUNTER — Encounter (HOSPITAL_BASED_OUTPATIENT_CLINIC_OR_DEPARTMENT_OTHER): Payer: Self-pay | Admitting: Orthopedic Surgery

## 2013-04-11 LAB — POCT HEMOGLOBIN-HEMACUE: Hemoglobin: 13.5 g/dL (ref 12.0–15.0)

## 2013-04-11 NOTE — Op Note (Signed)
NAME:  Breanna Dillon, Breanna Dillon NO.:  [id number]  MEDICAL RECORD NO.:  [id number]  LOCATION:                                 FACILITY:  PHYSICIAN:  Cindee Salt, M.D.            DATE OF BIRTH:  DATE OF PROCEDURE:  04/10/2013 DATE OF DISCHARGE:                              OPERATIVE REPORT   PREOPERATIVE DIAGNOSIS:  Subluxation of extensor tendons right middle ring and little fingers.  POSTOPERATIVE DIAGNOSIS:  Subluxation of extensor tendons right middle ring and little fingers.  OPERATION:  Reconstruction extensor hood with centralization extensor tendons, right middle ring and little fingers.  SURGEON:  Cindee Salt, MD  ASSISTANT:  Betha Loa, MD  ANESTHESIA:  General with local infiltration.  ANESTHESIOLOGIST:  Bedelia Person, MD  HISTORY:  The patient is an 79 year old female with a history of subluxation extensor tendons right middle, ring, and little fingers. She has elected to proceed to have these centralized.  Pre, peri, and postoperative course have been discussed with her and her daughters. She is aware that there is no guarantee with surgery; possibility of infection; recurrence of injury to arteries, nerves, tendons; incomplete relief of symptoms, dystrophy.  In the preoperative area, the patient is seen, the extremity marked by both patient and surgeon.  Antibiotic given.  PROCEDURE IN DETAIL:  The patient was brought to the operating room where general anesthetic was carried out without difficulty.  She was prepped using ChloraPrep, supine position with the right arm free. After adequate anesthesia was afforded, time-out taken, confirming patient and procedure.  The limb was exsanguinated with an Esmarch bandage.  Tourniquet placed high and the arm was inflated to 220 mmHg. A longitudinal incision was made over the metacarpophalangeal joint of the right middle finger, carried down through subcutaneous tissue. Bleeders were electrocauterized with  bipolar.  Dorsal sensory nerves were identified and protected.  The subluxating tendon was immediately __________.  This was then centralized by harvesting the juncture to the ring finger partially and maintaining a portion intact.  This tendon was then transferred through the extensor tendon proximal to the metacarpophalangeal joint, brought under the intermetacarpal ligament brought dorsally passed through the tendon and distally and clamped. The finger placed through full range motion, no further subluxation was noted.  This was then sutured into position with figure-of-eight 4-0 Mersilene sutures both proximally and distally.  A separate incision was then made between the middle, ring, and little fingers, allowing the extensor tendon to the ring finger to be isolated.  A portion of the tendon going to the little finger was then harvested, __________ junction to the little finger.  This allowed the extensor little finger to be pulled over with reconstruction of the ring finger.  A release of the abductor digiti quinti which was subluxated to the volar aspect by incising the hood on the ulnar aspect __________ centralization of the little finger with reconstruction to the ring finger for support.  This similar to the middle finger was passed through the extensor tendon around the intermetacarpal ligament brought back onto itself, sutured the finger through a full range of motion, no further subluxation was noted.  This was then sutured into position with figure-of-eight 4-0 Mersilene sutures.  The portion of the abductor digiti quinti, ulnar aspect was then harvested, passed through the extensor hood central tendon, both common and extensor digiti quinti brought under the intermetacarpal ligament between the ring and small fingers had been brought dorsally passed through the tendons again, sutured into position with figure-of-eight 4-0 Mersilene sutures, was maintained the  common digital extensor, extensor digiti quinti over the dorsal aspect of metacarpophalangeal joint of the little finger with full flexion and extension.  Wounds were copiously irrigated with saline.  The skin was then closed with interrupted 4-0 Vicryl Rapide sutures.  A sterile compressive dressing was applied after injection of 6 mL of 0.25% Marcaine without epinephrine into each wound.  The wrist was dorsiflexed, the metacarpophalangeal joints held in extension.  On deflation of the tourniquet, all fingers immediately pinked.  She was taken to the recovery room for observation in satisfactory condition. She will be discharged home to return in 1 week on Vicodin.          ______________________________ Cindee Salt, M.D.     GK/MEDQ  D:  04/10/2013  T:  04/10/2013  Job:  [id number]

## 2013-04-27 ENCOUNTER — Ambulatory Visit (INDEPENDENT_AMBULATORY_CARE_PROVIDER_SITE_OTHER): Payer: Medicare Other | Admitting: Emergency Medicine

## 2013-04-27 ENCOUNTER — Ambulatory Visit (HOSPITAL_COMMUNITY)
Admission: RE | Admit: 2013-04-27 | Discharge: 2013-04-27 | Disposition: A | Discharge status: Home / Self Care | Payer: Medicare Other | Source: Ambulatory Visit | Attending: Emergency Medicine | Admitting: Emergency Medicine

## 2013-04-27 ENCOUNTER — Ambulatory Visit: Payer: Medicare Other

## 2013-04-27 VITALS — BP 100/72 | HR 72 | Temp 98.0°F | Resp 17 | Ht 64.0 in | Wt 97.4 lb

## 2013-04-27 DIAGNOSIS — S0990XA Unspecified injury of head, initial encounter: Secondary | ICD-10-CM | POA: Insufficient documentation

## 2013-04-27 DIAGNOSIS — R4182 Altered mental status, unspecified: Secondary | ICD-10-CM | POA: Insufficient documentation

## 2013-04-27 DIAGNOSIS — M25562 Pain in left knee: Secondary | ICD-10-CM

## 2013-04-27 DIAGNOSIS — M25569 Pain in unspecified knee: Secondary | ICD-10-CM

## 2013-04-27 DIAGNOSIS — G319 Degenerative disease of nervous system, unspecified: Secondary | ICD-10-CM | POA: Insufficient documentation

## 2013-04-27 DIAGNOSIS — W06XXXA Fall from bed, initial encounter: Secondary | ICD-10-CM | POA: Insufficient documentation

## 2013-04-27 NOTE — Progress Notes (Signed)
[facility] [address] [place] [address] [phone]  Date:  04/27/2013   Name:  Breanna Dillon   DOB:  [birth year]   MRN:  [id number]  PCP:  Lorretta Harp, MD    Chief Complaint: injury to left leg and hit head this morning   History of Present Illness:  Breanna Dillon is a 79 y.o. very pleasant female patient who presents with the following:  Injured this morning when she slipped getting out of bed.  She injured her left knee on the floor and struck her head on the table.  She has no recollection of a loss of consciousness but family says she has some acute change in her balance and memory since the fall.  She claims no visual change, headache, nausea or vomiting.  She is ambulatory but has impressive swelling and contusion in her left knee.    Patient Active Problem List   Diagnosis Date Noted  . Pulmonary nodule 04/10/2012  . COPD (chronic obstructive pulmonary disease) 04/07/2012  . Weight loss, unintentional 03/07/2012  . Trigger finger 03/07/2012  . Medicare annual wellness visit, subsequent 03/07/2012  . Stressful life event affecting family 10/11/2011  . Orthostatic lightheadedness 06/18/2011  . Depressive reaction 06/18/2011  . Spinal stenosis   . DEGENERATIVE JOINT DISEASE, LUMBAR SPINE 07/13/2010  . DEGENERATIVE JOINT DISEASE 12/29/2009  . TOBACCO USE 07/28/2009  . TIREDNESS 02/28/2009  . Palpitations 12/23/2008  . OTHER CHEST PAIN 12/23/2008  . ELEVATED BLOOD PRESSURE 06/03/2008  . Right bundle branch block 04/16/2008  . BACK PAIN, THORACIC REGION, RIGHT 04/16/2008  . OSTEOPENIA 12/28/2007  . HEADACHE 12/28/2007  . ANXIETY STATE, UNSPECIFIED 12/26/2007  . COMMON MIGRAINE 12/26/2007  . GLAUCOMA 12/26/2007  . KNEE PAIN, LEFT 12/26/2007  . OSTEOPOROSIS 12/26/2007  . GAIT DISTURBANCE 12/26/2007  . TACHYCARDIA 12/26/2007    Past Medical History  Diagnosis Date  . Spinal stenosis     mild on back MRI, see eval 2007 and 2008  for r leg gait problem ? dyskinesia, MRI chronic changes  . Right bundle branch block   . Pain in thoracic spine   . Abnormality of gait   . Anxiety   . Pain in joint, lower leg   . Unspecified glaucoma(365.9)   . Migraine without aura, without mention of intractable migraine without mention of status migrainosus   . Osteoporosis, unspecified   . Palpitations     Past Surgical History  Procedure Laterality Date  . Tonsillectomy    . Cataract extraction      bilateral  . Eye surgery    . Electromagnetic navigation brochoscopy  05/03/2012    Procedure: ELECTROMAGNETIC NAVIGATION BRONCHOSCOPY;  Surgeon: Oretha Milch, MD;  Location: [facility] OR;  Service: Thoracic;  Laterality: Right;  Video bronchoscopy with endobronchial navigation  . Bronchoscopy    . Repair extensor tendon Right 04/10/2013    Procedure: CENTRALIZATION EXTENSOR TENDONS RIGHT MIDDLE FINGER, RIGHT RING FINGER, RIGHT SMALL FINGER;  Surgeon: Nicki Reaper, MD;  Location: [facility];  Service: Orthopedics;  Laterality: Right;    History  Substance Use Topics  . Smoking status: Current Every Day Smoker -- 50 years    Types: Cigarettes  . Smokeless tobacco: Never Used     Comment: 2-5 cigs a day--smoked off and on  . Alcohol Use: Yes     Comment: occ    Family History  Problem Relation Age of Onset  . Osteoarthritis Mother   .  Kidney cancer Father     Allergies  Allergen Reactions  . Codeine Phosphate Nausea And Vomiting    Reaction occurred 30 years ago and may no longer be valid, per patient.    Medication list has been reviewed and updated.  Current Outpatient Prescriptions on File Prior to Visit  Medication Sig Dispense Refill  . aspirin 325 MG tablet Take 325 mg by mouth daily as needed.       . beta carotene w/minerals (OCUVITE) tablet Take 1 tablet by mouth daily.      Marland Kitchen CALCIUM CARBONATE PO Take 1 tablet by mouth daily.      . Multiple Vitamin (MULTIVITAMIN WITH MINERALS) TABS Take 1 tablet  by mouth daily.      . sertraline (ZOLOFT) 50 MG tablet TAKE 1/2 TAB BY MOUTH X1 WEEK, THEN 1 TABLET ONCE DAILY *DONT TAKE WITH ST JOHNS WORT*  90 tablet  0  . travoprost, benzalkonium, (TRAVATAN Z) 0.004 % ophthalmic solution Place 1 drop into both eyes at bedtime.       . vitamin B-12 (CYANOCOBALAMIN) 100 MCG tablet Take 50 mcg by mouth daily.        Marland Kitchen VITAMIN D, CHOLECALCIFEROL, PO Take 1 capsule by mouth daily.      Marland Kitchen zinc gluconate 50 MG tablet Take 50 mg by mouth daily.      Marland Kitchen HYDROcodone-acetaminophen (NORCO) 5-325 MG per tablet Take 1 tablet by mouth every 6 (six) hours as needed for pain.  30 tablet  0   No current facility-administered medications on file prior to visit.    Review of Systems:  As per HPI, otherwise negative.    Physical Examination: Filed Vitals:   04/27/13 1630  BP: 100/72  Pulse: 72  Temp: 98 F (36.7 C)  Resp: 17   Filed Vitals:   04/27/13 1630  Height: 5\' 4"  (1.626 m)  Weight: 97 lb 6.4 oz (44.18 kg)   Body mass index is 16.71 kg/(m^2). Ideal Body Weight: Weight in (lb) to have BMI = 25: 145.3  GEN: WDWN, NAD, Non-toxic, A & O x 3 HEENT: contusion left temple, Normocephalic. Neck supple. No masses, No LAD. Ears and Nose: No external deformity. CV: RRR, No M/G/R. No JVD. No thrill. No extra heart sounds. PULM: CTA B, no wheezes, crackles, rhonchi. No retractions. No resp. distress. No accessory muscle use. ABD: S, NT, ND, +BS. No rebound. No HSM. EXTR: No c/c/e  Orthopedic appliance on right had.  Marked finger deviation on left hand.  Left knee contusion and abrasion.  Full ROM. NEURO Normal gait.  PSYCH: Normally interactive. Conversant. Not depressed or anxious appearing.  Calm demeanor.    Assessment and Plan: Closed head injury Contusion knee CT   Signed,  Phillips Odor, MD   [facility] reading (PRIMARY) by  Dr. Dareen Piano.  Negative knee.

## 2013-05-24 ENCOUNTER — Encounter: Payer: Self-pay | Admitting: Family Medicine

## 2013-05-24 ENCOUNTER — Telehealth: Payer: Self-pay | Admitting: Internal Medicine

## 2013-05-24 ENCOUNTER — Ambulatory Visit (INDEPENDENT_AMBULATORY_CARE_PROVIDER_SITE_OTHER): Payer: Medicare Other | Admitting: Family Medicine

## 2013-05-24 VITALS — BP 138/84 | Temp 98.4°F | Wt 96.0 lb

## 2013-05-24 DIAGNOSIS — M25559 Pain in unspecified hip: Secondary | ICD-10-CM

## 2013-05-24 DIAGNOSIS — M25552 Pain in left hip: Secondary | ICD-10-CM

## 2013-05-24 NOTE — Telephone Encounter (Signed)
Pt saw Dr Selena Batten today and needs a fup in one week (30 min) Pls advise.

## 2013-05-24 NOTE — Patient Instructions (Signed)
-  heat for 15 minutes twice daily on L hip/buttock  -tylenol 500-1000mg  up to two times daily if needed for pain  -caution with ambulation, use walker at all times  -follow up with your doctor (PCP) next week

## 2013-05-24 NOTE — Progress Notes (Addendum)
Chief Complaint  Patient presents with  . Hip Pain    HPI:  Breanna Dillon is an 79 yo F pt of Dr. Fabian Sharp here for acute visit for L buttock pain: -symptoms: L gluteal pain started yesterday - in muscle in L buttock, constant, mod pain, can bear weight -worse with/better with: worse with activity, better with heat and ibuprufen -denies: fevers, chills, radiation, inability to bear weight -pt with live in caregivers - present today, they think walker may not be the correct size -hx OA, spinal stenosis and gait abnormality, known - she brings a letter from her daughter that after hand surgery cog impairment worse and had several falls (pt reports because can't use cane due to hand pain after surgery)- evaluated in ED, no fall on this hip recently  ROS: See pertinent positives and negatives per HPI.  Past Medical History  Diagnosis Date  . Spinal stenosis     mild on back MRI, see eval 2007 and 2008 for r leg gait problem ? dyskinesia, MRI chronic changes  . Right bundle branch block   . Pain in thoracic spine   . Abnormality of gait   . Anxiety   . Pain in joint, lower leg   . Unspecified glaucoma(365.9)   . Migraine without aura, without mention of intractable migraine without mention of status migrainosus   . Osteoporosis, unspecified   . Palpitations     Family History  Problem Relation Age of Onset  . Osteoarthritis Mother   . Kidney cancer Father     History   Social History  . Marital Status: Widowed    Spouse Name: N/A    Number of Children: 3  . Years of Education: N/A   Occupational History  . retired    Social History Main Topics  . Smoking status: Current Every Day Smoker -- 50 years    Types: Cigarettes  . Smokeless tobacco: Never Used     Comment: 2-5 cigs a day--smoked off and on  . Alcohol Use: Yes     Comment: occ  . Drug Use: No  . Sexual Activity: None   Other Topics Concern  . None   Social History Narrative   Lives in [place] widowed     Previously worked at [employer] in [employer]   Retired   Attends [employer]   Husband with illness cancer dx and recent hospitalization with SBo and PNAdeceased              Current outpatient prescriptions:aspirin 325 MG tablet, Take 325 mg by mouth daily as needed. , Disp: , Rfl: ;  beta carotene w/minerals (OCUVITE) tablet, Take 1 tablet by mouth daily., Disp: , Rfl: ;  CALCIUM CARBONATE PO, Take 1 tablet by mouth daily., Disp: , Rfl: ;  Multiple Vitamin (MULTIVITAMIN WITH MINERALS) TABS, Take 1 tablet by mouth daily., Disp: , Rfl:  sertraline (ZOLOFT) 50 MG tablet, TAKE 1/2 TAB BY MOUTH X1 WEEK, THEN 1 TABLET ONCE DAILY *DONT TAKE WITH ST JOHNS WORT*, Disp: 90 tablet, Rfl: 0;  travoprost, benzalkonium, (TRAVATAN Z) 0.004 % ophthalmic solution, Place 1 drop into both eyes at bedtime. , Disp: , Rfl: ;  vitamin B-12 (CYANOCOBALAMIN) 100 MCG tablet, Take 50 mcg by mouth daily.  , Disp: , Rfl: ;  VITAMIN D, CHOLECALCIFEROL, PO, Take 1 capsule by mouth daily., Disp: , Rfl:  zinc gluconate 50 MG tablet, Take 50 mg by mouth daily., Disp: , Rfl: ;  HYDROcodone-acetaminophen (NORCO) 5-325 MG per tablet, Take 1 tablet by mouth every 6 (six) hours as needed for pain., Disp: 30 tablet, Rfl: 0  EXAM:  Filed Vitals:   05/24/13 1358  BP: 138/84  Temp: 98.4 F (36.9 C)    Body mass index is 16.47 kg/(m^2).  GENERAL: vitals reviewed and listed above, alert, oriented, appears well hydrated and in no acute distress  HEENT: atraumatic, conjunttiva clear, no obvious abnormalities on inspection of external nose and ears  NECK: no obvious masses on inspection  LUNGS: clear to auscultation bilaterally, no wheezes, rales or rhonchi, good air movement  CV: HRRR, no peripheral edema  MS: moves all extremities without noticeable abnormality -gait stable and bears weight on affected side -no signs of limping on exam -TTP L upper glutes, no bony TTP, no increased pain with  int/ext rotation of hip, ASIS compression or lateral hip compression  PSYCH: pleasant and cooperative, no obvious depression or anxiety  ASSESSMENT AND PLAN:  Discussed the following assessment and plan:  Hip pain, acute, left  -hip pain - per exam seems more muscular in nature - discussed she is high risk for fractures given frailty, chronic gait issues and fall prevention strategies discussed at length -advised heat, tylenol, conservative tx -live in caregiver is present and reports chronic issues with gait, reporst pt won't use walker or cane - thinks walker is not correct size - pt reports hand issue after surgery have made it difficult to use cane or walker -now that hand doing better I advised to use cane/walker at ALL times and advised given hx falls should have caregiver present while ambulating - they will bring walker to follow up appt with PCP in 1-2 weeks -letter from daughter passed on to PCP - pt has known long term issues with gait, CI, and some falls recently which may be related to her not being able to use cane/walker after surgery -Patient advised to return or notify a doctor immediately if symptoms worsen or persist or new concerns arise.  Patient Instructions  -heat for 15 minutes twice daily on L hip/buttock  -tylenol 500-1000mg  up to two times daily if needed for pain  -caution with ambulation, use walker at all times  -follow up with your doctor (PCP) next week       Adeeb Konecny, Dahlia Client R.

## 2013-05-28 ENCOUNTER — Encounter: Payer: Self-pay | Admitting: Internal Medicine

## 2013-05-28 ENCOUNTER — Ambulatory Visit (INDEPENDENT_AMBULATORY_CARE_PROVIDER_SITE_OTHER): Payer: Medicare Other | Admitting: Internal Medicine

## 2013-05-28 VITALS — BP 158/80 | HR 69 | Temp 97.7°F | Wt 96.0 lb

## 2013-05-28 DIAGNOSIS — M25559 Pain in unspecified hip: Secondary | ICD-10-CM | POA: Insufficient documentation

## 2013-05-28 DIAGNOSIS — M25552 Pain in left hip: Secondary | ICD-10-CM

## 2013-05-28 DIAGNOSIS — R269 Unspecified abnormalities of gait and mobility: Secondary | ICD-10-CM

## 2013-05-28 DIAGNOSIS — R5381 Other malaise: Secondary | ICD-10-CM

## 2013-05-28 DIAGNOSIS — Z9181 History of falling: Secondary | ICD-10-CM

## 2013-05-28 DIAGNOSIS — R911 Solitary pulmonary nodule: Secondary | ICD-10-CM

## 2013-05-28 DIAGNOSIS — R5383 Other fatigue: Secondary | ICD-10-CM

## 2013-05-28 DIAGNOSIS — R030 Elevated blood-pressure reading, without diagnosis of hypertension: Secondary | ICD-10-CM

## 2013-05-28 LAB — TSH: TSH: 1.89 u[IU]/mL (ref 0.35–5.50)

## 2013-05-28 LAB — POCT URINALYSIS DIP (MANUAL ENTRY)
Nitrite, UA: NEGATIVE
Protein Ur, POC: NEGATIVE
Spec Grav, UA: 1.02
Urobilinogen, UA: 0.2

## 2013-05-28 LAB — BASIC METABOLIC PANEL
BUN: 18 mg/dL (ref 6–23)
CO2: 30 mEq/L (ref 19–32)
Calcium: 9.5 mg/dL (ref 8.4–10.5)
Chloride: 106 mEq/L (ref 96–112)
Creatinine, Ser: 1.1 mg/dL (ref 0.4–1.2)

## 2013-05-28 LAB — SEDIMENTATION RATE: Sed Rate: 13 mm/hr (ref 0–22)

## 2013-05-28 LAB — CBC WITH DIFFERENTIAL/PLATELET
Basophils Absolute: 0.1 10*3/uL (ref 0.0–0.1)
Basophils Relative: 1.2 % (ref 0.0–3.0)
Eosinophils Absolute: 0.1 10*3/uL (ref 0.0–0.7)
Lymphocytes Relative: 14.5 % (ref 12.0–46.0)
MCHC: 34 g/dL (ref 30.0–36.0)
MCV: 89.4 fl (ref 78.0–100.0)
Monocytes Absolute: 0.5 10*3/uL (ref 0.1–1.0)
Neutrophils Relative %: 74.5 % (ref 43.0–77.0)
Platelets: 287 10*3/uL (ref 150.0–400.0)
RDW: 13.5 % (ref 11.5–14.6)

## 2013-05-28 LAB — T4, FREE: Free T4: 0.95 ng/dL (ref 0.60–1.60)

## 2013-05-28 LAB — HEPATIC FUNCTION PANEL
ALT: 13 U/L (ref 0–35)
Bilirubin, Direct: 0.1 mg/dL (ref 0.0–0.3)
Total Bilirubin: 0.3 mg/dL (ref 0.3–1.2)

## 2013-05-28 LAB — HEMOGLOBIN A1C: Hgb A1c MFr Bld: 5.8 % (ref 4.6–6.5)

## 2013-05-28 NOTE — Patient Instructions (Addendum)
We should arrange for  You to see ortho about the hip   Would advise.   . See ortho   About this and will do referral.  Will reviewed record  About  Metabolic factors to check . May benefit from  physical therapy .    Again for conditioning and mobility assessment  For fall prevention.  Plan follow  Up  After that.

## 2013-05-28 NOTE — Telephone Encounter (Signed)
Seen as sda  mond [phone]

## 2013-05-28 NOTE — Progress Notes (Signed)
Chief Complaint  Patient presents with  . Gait Problem    Continues to have hip pain.  Pt has fell twice.    HPI: Patient comes in today for SDA for  problem evaluation.   2 daughters    Here with her today.  See dr Reuel Derby note from last week.   Had a fall onto her bottom  8 15 when she turned to the right side and went down falling on her bottom ;and jarred herself got help standing up asked for no other help until later when her left hip started hurting..  Worse with walking and standing  And best with laying down  For a while.  Took one hydrocodone today that was given to her for her post op hand pain. Denies that making her dizzy a little bit of relief. Hip pain continued after the fall.  Not getting better since last week now it is difficult for her to get up from a chair.  My last visit with her was in January since that time she had hand surgery in June. Right hand is much better and now write and do ADLs.  Sitters  Live in .  Reports not needing mobility help before the surgery . However she has been having an abnormal gait for a number of years evaluated by 2 neurologists and no specific diagnosis given.   She's not had physical therapy since her surgery because she been doing well. They have had physical therapy in the past year  Daughters are concerned because she is more fatigued walking to the mailbox or at the store. Denies specific shortness of breath she still smokes that time she's been followed by pulmonary for pulmonary nodule  She states that usually her blood pressure is low.  ROS: See pertinent positives and negatives per HPI. No current chest pain shortness of breath racing heart. Denies significant depression at this time.  Past Medical History  Diagnosis Date  . Spinal stenosis     mild on back MRI, see eval 2007 and 2008 for r leg gait problem ? dyskinesia, MRI chronic changes  . Right bundle branch block   . Pain in thoracic spine   . Abnormality of gait   .  Anxiety   . Pain in joint, lower leg   . Unspecified glaucoma(365.9)   . Migraine without aura, without mention of intractable migraine without mention of status migrainosus   . Osteoporosis, unspecified   . Palpitations     Family History  Problem Relation Age of Onset  . Osteoarthritis Mother   . Kidney cancer Father     History   Social History  . Marital Status: Widowed    Spouse Name: N/A    Number of Children: 3  . Years of Education: N/A   Occupational History  . retired    Social History Main Topics  . Smoking status: Current Every Day Smoker -- 50 years    Types: Cigarettes  . Smokeless tobacco: Never Used     Comment: 2-5 cigs a day--smoked off and on  . Alcohol Use: Yes     Comment: occ  . Drug Use: No  . Sexual Activity: None   Other Topics Concern  . None   Social History Narrative   Lives in [place] widowed    Previously worked at [employer] in [employer]   Retired   Attends [employer]      G3P3   Husband  cancer dx SBo and [employer]  Lives alone now has sitters in the household.             Outpatient Encounter Prescriptions as of 05/28/2013  Medication Sig Dispense Refill  . beta carotene w/minerals (OCUVITE) tablet Take 1 tablet by mouth daily.      Marland Kitchen CALCIUM CARBONATE PO Take 1 tablet by mouth daily.      Marland Kitchen HYDROcodone-acetaminophen (NORCO) 5-325 MG per tablet Take 1 tablet by mouth every 6 (six) hours as needed for pain.  30 tablet  0  . Multiple Vitamin (MULTIVITAMIN WITH MINERALS) TABS Take 1 tablet by mouth daily.      . sertraline (ZOLOFT) 50 MG tablet TAKE 1/2 TAB BY MOUTH X1 WEEK, THEN 1 TABLET ONCE DAILY *DONT TAKE WITH [facility]*  90 tablet  0  . travoprost, benzalkonium, (TRAVATAN Z) 0.004 % ophthalmic solution Place 1 drop into both eyes at bedtime.       . vitamin B-12 (CYANOCOBALAMIN) 100 MCG tablet Take 50 mcg by mouth daily.        Marland Kitchen VITAMIN D, CHOLECALCIFEROL, PO Take 1 capsule by mouth daily.      Marland Kitchen  zinc gluconate 50 MG tablet Take 50 mg by mouth daily.      . [DISCONTINUED] aspirin 325 MG tablet Take 325 mg by mouth daily as needed.        No facility-administered encounter medications on file as of 05/28/2013.    EXAM:  BP 158/80  Pulse 69  Temp(Src) 97.7 F (36.5 C) (Oral)  Wt 96 lb (43.545 kg)  BMI 16.47 kg/m2  SpO2 96%  Body mass index is 16.47 kg/(m^2).  GENERAL: vitals reviewed and listed above, alert, oriented, appears well hydrated and in no acute distress appears quite frail but cognitively appears intact. Still says her sense of humor. Difficulty rising from chair but can do this feels like the pain is there needs to steady herself and walk independently but very unsure of herself and prefers to hold on. Can elevate on toes and heels points to the left lateral hip area and gluteal area no groin pain. HEENT: atraumatic, conjunctiva  clear, no obvious abnormalities on inspection of external nose and ears  NECK: no obvious masses on inspection palpation  LUNGS: clear to auscultation bilaterally, no wheezes, rales or rhonchi, some kyphosis CV: HRRR, no clubbing cyanosis or  peripheral edema nl cap refill  MS: moves all extremities  minimal deformity right hand left hand and with contractures. PSYCH: pleasant and cooperative,  Lab Results  Component Value Date   WBC 6.5 05/28/2013   HGB 13.5 05/28/2013   HCT 39.8 05/28/2013   PLT 287.0 05/28/2013   GLUCOSE 108* 05/28/2013   CHOL 224* 03/07/2012   TRIG 67.0 03/07/2012   HDL 91.30 03/07/2012   LDLDIRECT 130.7 03/07/2012   ALT 13 05/28/2013   AST 16 05/28/2013   NA 143 05/28/2013   K 3.6 05/28/2013   CL 106 05/28/2013   CREATININE 1.1 05/28/2013   BUN 18 05/28/2013   CO2 30 05/28/2013   TSH 1.89 05/28/2013   HGBA1C 5.8 05/28/2013   she had a head CT without contrast July 25 with no acute findings this was done after a fall on her knee in the emergency room couple weeks after the surgery.  ASSESSMENT AND PLAN:  Discussed the  following assessment and plan:  Hip pain, left - Plan: Basic metabolic panel, CBC with Differential, Hemoglobin A1c, Hepatic function panel, TSH, T4, free, Sedimentation rate, POCT urinalysis  dipstick, Ambulatory referral to Orthopedic Surgery  Tired - Plan: Basic metabolic panel, CBC with Differential, Hemoglobin A1c, Hepatic function panel, TSH, T4, free, Sedimentation rate, POCT urinalysis dipstick  GAIT DISTURBANCE - Plan: Basic metabolic panel, CBC with Differential, Hemoglobin A1c, Hepatic function panel, TSH, T4, free, Sedimentation rate, POCT urinalysis dipstick, Ambulatory referral to Orthopedic Surgery  Hx of fall - Plan: Basic metabolic panel, CBC with Differential, Hemoglobin A1c, Hepatic function panel, TSH, T4, free, Sedimentation rate, POCT urinalysis dipstick, Ambulatory referral to Orthopedic Surgery  Pulmonary nodule  ELEVATED BLOOD PRESSURE - pt says usually ok  coming down sitting poss from pain  Appearance a little decline since surgery in June. I see a number of issues ongoing the most immediate is the left hip pain and the recent fall. She's had a gait disturbance for a number of years apparently is getting worse and because of her general decline I think eventually physical therapy may be helpful to look at fall prevention conditioning to maintain or improve current functioning. However would get the hip evaluated first.  Review the record showed no laboratory testing for metabolic problems for over a year   we'll check laboratory studies today. Plan orthopedic referral regard to her hip pain and depending on what that says consider physical therapy if they don't advise it.  Plan a followup visit after the above is assessed as a side next step that would be helpful    -Patient advised to return or notify health care team  if symptoms worsen or persist or new concerns arise.  Patient Instructions  We should arrange for  You to see ortho about the hip   Would advise.    . See ortho   About this and will do referral.  Will reviewed record  About  Metabolic factors to check . May benefit from  physical therapy .    Again for conditioning and mobility assessment  For fall prevention.  Plan follow  Up  After that.    Neta Mends. Delmore Sear M.D.

## 2013-05-30 ENCOUNTER — Ambulatory Visit: Payer: Medicare Other

## 2013-05-30 ENCOUNTER — Ambulatory Visit (INDEPENDENT_AMBULATORY_CARE_PROVIDER_SITE_OTHER): Payer: Medicare Other | Admitting: Family Medicine

## 2013-05-30 VITALS — BP 176/92 | HR 94 | Temp 99.4°F | Resp 16 | Ht 63.0 in | Wt 95.0 lb

## 2013-05-30 DIAGNOSIS — M545 Low back pain: Secondary | ICD-10-CM

## 2013-05-30 DIAGNOSIS — M533 Sacrococcygeal disorders, not elsewhere classified: Secondary | ICD-10-CM

## 2013-05-30 DIAGNOSIS — W19XXXA Unspecified fall, initial encounter: Secondary | ICD-10-CM

## 2013-05-30 MED ORDER — TRAMADOL HCL 50 MG PO TABS: ORAL_TABLET | ORAL | Status: DC

## 2013-05-30 NOTE — Patient Instructions (Addendum)
Thank you for coming in tonight Continue to use heat and tylenol for pain. Take 1 aleve 2 x per day with food for the next few days Followup with PCP in 1 week. Try tramadol for pain.   Tailbone Injury The tailbone (coccyx) is the small bone at the lower end of the spine. A tailbone injury may involve stretched ligaments, bruising, or a broken bone (fracture). Women are more vulnerable to this injury due to having a wider pelvis. CAUSES  This type of injury typically occurs from falling and landing on the tailbone. Repeated strain or friction from actions such as rowing and bicycling may also injure the area. The tailbone can be injured during childbirth. Infections or tumors may also press on the tailbone and cause pain. Sometimes, the cause of injury is unknown. SYMPTOMS   Bruising.  Pain when sitting.  Painful bowel movements.  In women, pain during intercourse. DIAGNOSIS  Your caregiver can diagnose a tailbone injury based on your symptoms and a physical exam. X-rays may be taken if a fracture is suspected. Your caregiver may also use an MRI scan imaging test to evaluate your symptoms. TREATMENT  Your caregiver may prescribe medicines to help relieve your pain. Most tailbone injuries heal on their own in 4 to 6 weeks. However, if the injury is caused by an infection or tumor, the recovery period may vary. PREVENTION  Wear appropriate padding and sports gear when bicycling and rowing. This can help prevent an injury from repeated strain or friction. HOME CARE INSTRUCTIONS   Put ice on the injured area.  Put ice in a plastic bag.  Place a towel between your skin and the bag.  Leave the ice on for 15-20 minutes, every hour while awake for the first 1 to 2 days.  Sit on a large, rubber or inflated ring or cushion to ease your pain. Lean forward when sitting to help decrease discomfort.  Avoid sitting for long periods of time.  Increase your activity as the pain allows.  Only  take over-the-counter or prescription medicines for pain, discomfort, or fever as directed by your caregiver.  You may use stool softeners if it is painful to have a bowel movement, or as directed by your caregiver.  Eat a diet with plenty of fiber to help prevent constipation.  Keep all follow-up appointments as directed by your caregiver. SEEK MEDICAL CARE IF:   Your pain becomes worse.  Your bowel movements cause a great deal of discomfort.  You are unable to have a bowel movement.  You have a fever. MAKE SURE YOU:  Understand these instructions.  Will watch your condition.  Will get help right away if you are not doing well or get worse. Document Released: 09/17/2000 Document Revised: 12/13/2011 Document Reviewed: 04/15/2011 [facility] Patient Information 2014 [place], [place].

## 2013-05-30 NOTE — Progress Notes (Signed)
  Subjective:    Patient ID: Breanna Dillon, female    DOB: 13-Aug-1930, 79 y.o.   MRN: [id number]  HPI 79 year old female presents after fall 2 weeks ago. H/o osteoporosis. Has seen 2 doctors and has had persistent pain that is not improving. Was told that she needed xrays but doctor never ordered. Pain is across low back. Pain is achy and constant. Hard to move. Has tried heating pad with some relief. No pain radiating down legs. No numbness or tingling. Has tried hydrocodone without much relief. No history of back problems. Pain increases with movement. Wakes up at night if the heating pad turns off. No incontinence or loss of control of bowels or bladder. No fever. No blood in stool or urine.  Review of Systems  All other systems reviewed and are negative.      Objective:   Physical Exam  Constitutional: She is oriented to person, place, and time. She appears well-developed.  HENT:  Head: Normocephalic and atraumatic.  Eyes: Conjunctivae and EOM are normal.  Neck: Normal range of motion.  Cardiovascular: Normal rate and regular rhythm.   Pulmonary/Chest: Effort normal. No respiratory distress.  Neurological: She is alert and oriented to person, place, and time.  Skin: Skin is warm and dry.  Psychiatric: She has a normal mood and affect. Her behavior is normal.  Thin and frail appearing elderly female. Unsteady on feet. High fall risk Gait is antalgic and unsteady. Difficulty getting out of chair and on and off exam table No TTP along midline vertebrae, sacrum, lumbar musculature Decr ROM in lumbar back due to pain and instability Strength 5/5 in BLE. Reflexes 2+ at achilles and patella Deformity of hand with flexion contracture of fingers   Radiographs: [facility] reading (PRIMARY) by  Dr. Neomia Dear. No sign of acute fracture. There is evidence of previous probable vertebral compression fracture at T10 and T12. Degenerative changes in lumbar spine with loss of disc height. No acute fracture.   Assessment & Plan:  #1. Low back/Tailbone pain after fall backwards onto buttocks - Continue heat and tylenol. Aleve 1 tab 2 x per day. - Recommend PT for gait training and ROM - Xrays negative for fracture - Tramadol given prn pain

## 2013-05-31 ENCOUNTER — Encounter: Payer: Self-pay | Admitting: Family Medicine

## 2013-05-31 ENCOUNTER — Telehealth: Payer: Self-pay | Admitting: Internal Medicine

## 2013-05-31 NOTE — Telephone Encounter (Signed)
Pt daughter is calling to request lab results from 8/25. She would also like a copy mailed to the pt's address. Please assist.

## 2013-06-01 ENCOUNTER — Telehealth: Payer: Self-pay | Admitting: Internal Medicine

## 2013-06-01 ENCOUNTER — Telehealth: Payer: Self-pay

## 2013-06-01 NOTE — Telephone Encounter (Signed)
Pt was seen by Dr Neva Seat on the 27th and she was told to follow up with her PCP about doing home physical therapy she states she hasnt been able to get in touch with anyone and would like somone to call her back  Call back number is [phone]

## 2013-06-01 NOTE — Telephone Encounter (Signed)
PT's live in sitter French Ana) called to request physical therapy for the patient. Please assist.

## 2013-06-03 NOTE — Progress Notes (Signed)
Xray read and patient discussed with Dr. Voss. Agree with assessment and plan of care per her note.   

## 2013-06-04 ENCOUNTER — Telehealth: Payer: Self-pay | Admitting: Radiology

## 2013-06-04 DIAGNOSIS — R937 Abnormal findings on diagnostic imaging of other parts of musculoskeletal system: Secondary | ICD-10-CM

## 2013-06-04 DIAGNOSIS — M545 Low back pain: Secondary | ICD-10-CM

## 2013-06-04 NOTE — Telephone Encounter (Signed)
Message copied by Caffie Damme on Mon Jun 04, 2013  4:39 PM ------      Message from: [place], [place] R      Created: Sun Jun 03, 2013 10:33 PM       See last office visit  with Dr. Neomia Dear.  Patient seen for back pain.  XR overread did note T12 compression fracture of unknown duration. This could be a cause of her back pain, but MRI or bone scan would better detect if this is recent. It did not appear to be present on 2011 MRI of back.  Please call her and advise her of this, and check status. We can schedule MRI if needed, and can hold on physical therapy until this is better determined. Let me know if there are questions.-JG. ------

## 2013-06-04 NOTE — Telephone Encounter (Signed)
Patient indicates she did get a phone call about this, but not sure who called her. I did speak to her and she does want to proceed with the MRI she is claustrophobic, will need open scan. To you FYI

## 2013-06-04 NOTE — Telephone Encounter (Signed)
MRI ordered, but call pt and verify no metal or pacemakers. It appears she did have an MRI in 2011.

## 2013-06-04 NOTE — Telephone Encounter (Signed)
Thank you. I called pt back and she does not have any metal clips pacemakers, etc she is claustrophobic.

## 2013-06-04 NOTE — Telephone Encounter (Signed)
Called to let her know there is a message to PCP regarding this, today is holiday, she should hear by mid week.

## 2013-06-04 NOTE — Telephone Encounter (Signed)
Patient indicates MRI was to be done for her, for her lumbar spine in regards to her compression fracture, please advise. I do not see order for this. Dr Neomia Dear has seen patient, you reviewed.

## 2013-06-05 ENCOUNTER — Telehealth: Payer: Self-pay | Admitting: Internal Medicine

## 2013-06-05 NOTE — Telephone Encounter (Signed)
Spoke to the pt.  She does not want physical therapy at this time.  She is having an MRI next week and would prefer to wait until those results come back.

## 2013-06-05 NOTE — Telephone Encounter (Signed)
Pt would like a sedative call into [facility] [address]. Pt is having a MRI on 06-08-13 AT 930AM. Per pt Dr Neva Seat will not prescribe a sedative.

## 2013-06-05 NOTE — Telephone Encounter (Signed)
Spoke to the pt.  She did receive the letter I sent to her in the mail.

## 2013-06-05 NOTE — Telephone Encounter (Signed)
I am not clear on this request .  The provider  ordering the scan should opine on  Pre procedure sedation.  I can send the ordering doctor a message .  (What medication is she requesting  . Sedative  medications cause increase  risk of falling so great caution should be observed .Has she used a medication in the past she didn't have a problem with?)

## 2013-06-06 MED ORDER — LORAZEPAM 0.5 MG PO TABS: ORAL_TABLET | ORAL | Status: DC

## 2013-06-06 NOTE — Telephone Encounter (Signed)
Dr. Fabian Sharp - pharmacy called regarding this request, as patient thought it was ready. Per patient, she has taken Valium in the past for things like this, with no problems. However, she does not remember what dose she was given. She would like just a little to take prior to MRI. Please advise and send to pharmacy if appropriate.

## 2013-06-06 NOTE — Telephone Encounter (Signed)
Per Dr Fabian Sharp - 1 time only - ativan 0.5mg  1 hour - 30 min before MRI #1 no refills.  I spoke with patient and explained that is is usually the provider that orders the MRI that prescribes the medication.   Patient states she asked Dr Chilton Si and she was told to call her PCP.  Rx called into pharmacy

## 2013-06-08 ENCOUNTER — Ambulatory Visit
Admission: RE | Admit: 2013-06-08 | Discharge: 2013-06-08 | Disposition: A | Discharge status: Home / Self Care | Payer: Medicare Other | Source: Ambulatory Visit | Attending: Family Medicine | Admitting: Family Medicine

## 2013-06-08 DIAGNOSIS — R937 Abnormal findings on diagnostic imaging of other parts of musculoskeletal system: Secondary | ICD-10-CM

## 2013-06-08 DIAGNOSIS — M545 Low back pain: Secondary | ICD-10-CM

## 2013-06-11 ENCOUNTER — Telehealth: Payer: Self-pay

## 2013-06-11 ENCOUNTER — Ambulatory Visit: Payer: Medicare Other

## 2013-06-11 ENCOUNTER — Ambulatory Visit (INDEPENDENT_AMBULATORY_CARE_PROVIDER_SITE_OTHER): Payer: Medicare Other | Admitting: Emergency Medicine

## 2013-06-11 VITALS — BP 142/82 | HR 85 | Temp 98.0°F | Resp 17 | Ht 64.0 in | Wt 95.0 lb

## 2013-06-11 DIAGNOSIS — R05 Cough: Secondary | ICD-10-CM

## 2013-06-11 DIAGNOSIS — S22000K Wedge compression fracture of unspecified thoracic vertebra, subsequent encounter for fracture with nonunion: Secondary | ICD-10-CM

## 2013-06-11 DIAGNOSIS — IMO0002 Reserved for concepts with insufficient information to code with codable children: Secondary | ICD-10-CM

## 2013-06-11 MED ORDER — HYDROCODONE-ACETAMINOPHEN 5-325 MG PO TABS: ORAL_TABLET | ORAL | Status: DC

## 2013-06-11 NOTE — Telephone Encounter (Signed)
I have made appt for her to see Dr Roda Shutters at [facility], tomorrow at 1045. Please fax information

## 2013-06-11 NOTE — Telephone Encounter (Signed)
Has not resulted yet.

## 2013-06-11 NOTE — Telephone Encounter (Signed)
[name] STATES HER MOM HAD AN MRI DONE AND HASN'T HEARD RESULTS. SHE IS IN A LOT OF PAIN AND WAS ADVISED TO BRING HER BACK IN IF NEEDED, BUT WOULD LIKE TO KNOW RESULTS FIRST. PLEASE CALL [phone]

## 2013-06-11 NOTE — Progress Notes (Signed)
  Subjective:    Patient ID: Breanna Dillon, female    DOB: January 18, 1930, 79 y.o.   MRN: [id number]  HPI 79 y.o. Female presents to clinic for follow up from MRI. Also having trouble with deep unproductive cough x 1 week. Dr. Vassie Loll is currently watching a spot on her lung.  Has appt with Dr. Charlann Boxer scheduled. Was told there was a compression fracture at T12. Patient extremely debilitated. She continues to smoke she is followed by Dr. Vassie Loll [place] lesion in a right lower lobe. This was hypermetabolic by PET scan however has appeared to be stable on CT scanning. It is currently being followed clinically. She is in constant pain in her back. She is 24 7 care at home Had fall on august 15th. Has day by day gotten worse. She has been on ultram for pain and is unable to get any relief.   Review of Systems     Objective:   Physical Exam HEENT exam is unremarkable patient's neck is supple chest reveals a few rhonchi patient looks extremely frail extremities without edema. Heart is regular rate and rhythm  [facility] reading (PRIMARY) by  Dr. Cleta Alberts there is a compression fracture at T12. I did not see any pneumonic infiltrates. There is a nodular lesion in the right lower lobe that has been previously evaluated by CT. She also has had a recent MRI of the LS-spine.         Assessment & Plan:  Case discussed with the radiologist at [facility]. She does have approximately 20% compression fracture at T12 with mild retropulsion but without cord compression. On her chest x-ray today I do not see any pneumonic infiltrates. She does have a dry cough. She is referred to [facility] orthopedics for evaluation. She was given a prescription for Vicodin 5 mg to take one half tablet every 6 hours as needed for pain.

## 2013-06-11 NOTE — Patient Instructions (Addendum)
Please followup with Dr. Fabian Sharp regarding your recent compression fracture and abnormal appearance of the endometrium. Appointment being made with an orthopedist for followup

## 2013-06-25 ENCOUNTER — Ambulatory Visit (INDEPENDENT_AMBULATORY_CARE_PROVIDER_SITE_OTHER)
Admission: RE | Admit: 2013-06-25 | Discharge: 2013-06-25 | Disposition: A | Discharge status: Home / Self Care | Payer: Medicare Other | Source: Ambulatory Visit | Attending: Pulmonary Disease | Admitting: Pulmonary Disease

## 2013-06-25 DIAGNOSIS — R911 Solitary pulmonary nodule: Secondary | ICD-10-CM

## 2013-06-25 MED ORDER — IOHEXOL 300 MG/ML  SOLN
80.0000 mL | Freq: Once | INTRAMUSCULAR | Status: AC | PRN
  Administered 2013-06-25: 80 mL via INTRAVENOUS

## 2013-06-26 ENCOUNTER — Telehealth: Payer: Self-pay | Admitting: Pulmonary Disease

## 2013-06-26 NOTE — Telephone Encounter (Signed)
Result Note    Unchanged size - discuss plan on FU   I spoke with patient about results and she verbalized understanding and had no questions

## 2013-07-09 ENCOUNTER — Other Ambulatory Visit: Payer: Self-pay | Admitting: Internal Medicine

## 2013-07-11 ENCOUNTER — Telehealth: Payer: Self-pay | Admitting: Internal Medicine

## 2013-07-11 NOTE — Telephone Encounter (Addendum)
Pt would like to know the first date she started seeing Dr Fabian Sharp.

## 2013-07-12 ENCOUNTER — Ambulatory Visit: Payer: Medicare Other | Admitting: Pulmonary Disease

## 2013-07-24 ENCOUNTER — Encounter: Payer: Self-pay | Admitting: Pulmonary Disease

## 2013-07-24 ENCOUNTER — Ambulatory Visit (INDEPENDENT_AMBULATORY_CARE_PROVIDER_SITE_OTHER): Payer: Medicare Other | Admitting: Pulmonary Disease

## 2013-07-24 VITALS — BP 140/86 | HR 122 | Temp 97.1°F | Ht 65.0 in | Wt 94.8 lb

## 2013-07-24 DIAGNOSIS — R911 Solitary pulmonary nodule: Secondary | ICD-10-CM

## 2013-07-24 DIAGNOSIS — J449 Chronic obstructive pulmonary disease, unspecified: Secondary | ICD-10-CM

## 2013-07-24 NOTE — Patient Instructions (Addendum)
Nodule is stable in size Follow up scan in June 2015 You have to STOP smoking altogether

## 2013-07-24 NOTE — Progress Notes (Signed)
  Subjective:    Patient ID: Breanna Dillon, female    DOB: [birth year], 79 y.o.   MRN: [id number]  HPI   PCP - [facility]  Son-in law- Dr. Karleen Hampshire [phone] (c), [phone] (o), [phone] (h)   79 y.o heavy smoker for FU of COPD - gold A&  RLL nodule.   She went in for a preventive visit and follow-up of trigger finger on her left making it hard to grip.  Chest xray showed Partially calcified oval lesion in the right infrahilar region.  Ct chest 03/21/12 showed a tiny subpleural nodule in the left upper lobe which measures 3.3 mm. Within the right upper lobe there was a 2.5 mm nodule. There was a large well circumscribed pulmonary nodule in the right lower lobe which measured 2.2 x 1.8 cm.  On PET there was some low level hypermetabolic activity (SUVmax = 4.3). No other sites of hypermetabolic activity identified within the neck, chest, abdomen or pelvis were noted.  PFTs showed surprisingly preserved FEV1 at 75% post BD but DLCO was decreased at 38%  ENB biopsies neg, able to navigate within 2 cm of lesion    07/24/2013  Chief Complaint  Patient presents with  . pulmonary nodule    follow up:  review CT done 06/25/13,SOB same,no cough,feels like hrt. beating fast at times,denies wheezing,no cp,no appetite   Underwent surgery on right hand.  Had a fall, now in a back brace CT chest 06/2013 -unchanged size of RLL nodule since 6/13 , Compression deformity at T12 Pt has been having difficulty with balance and muscle weakness-having multiple falls.  Smokes 1-2 cigs/d   Review of Systems neg for any significant sore throat, dysphagia, itching, sneezing, nasal congestion or excess/ purulent secretions, fever, chills, sweats, unintended wt loss, pleuritic or exertional cp, hempoptysis, orthopnea pnd or change in chronic leg swelling. Also denies presyncope, palpitations, heartburn, abdominal pain, nausea, vomiting, diarrhea or change in bowel or urinary habits, dysuria,hematuria, rash,  arthralgias, visual complaints, headache, numbness weakness or ataxia.     Objective:   Physical Exam  Gen. Pleasant,thin woman in no distress, anxious affect ENT - no lesions, no post nasal drip Neck: No JVD, no thyromegaly, no carotid bruits Lungs: no use of accessory muscles, no dullness to percussion, decreased without rales or rhonchi  Cardiovascular: Rhythm regular, heart sounds  normal, no murmurs or gallops, no peripheral edema Abdomen: soft and non-tender, no hepatosplenomegaly, BS normal. Musculoskeletal: No deformities, no cyanosis or clubbing Neuro:  alert, non focal        Assessment & Plan:

## 2013-07-25 NOTE — Assessment & Plan Note (Signed)
Smoking cessation,mosty important intervention here She appears frail & will be mod -high risk for surgery if needed, although lung function is not terrible. May be best to treat conservatively

## 2013-07-25 NOTE — Assessment & Plan Note (Signed)
6/13 RLL 2.2 cm nodule, PET indeterminate -unchanged 06/2013 81m FU In 03/2014

## 2013-09-03 ENCOUNTER — Telehealth: Payer: Self-pay | Admitting: Internal Medicine

## 2013-09-03 NOTE — Telephone Encounter (Signed)
Breanna Dillon had sent fax last week concerning pt. Would like pt's current med list. They will be managing pt meds. Also pt has moved to independent living and experiencing transitional issues. Agitation, acting out, would like to request prn a med for agitation or do they need to see pt?  [facility]

## 2013-09-03 NOTE — Telephone Encounter (Signed)
Paperwork has been sent to the front to be faxed along with a note that the pt will need to be seen in the office, per [facility], to receive a prescription for agitation.

## 2013-09-06 ENCOUNTER — Emergency Department (HOSPITAL_COMMUNITY): Payer: Medicare Other

## 2013-09-06 ENCOUNTER — Emergency Department (HOSPITAL_COMMUNITY)
Admission: EM | Admit: 2013-09-06 | Discharge: 2013-09-06 | Disposition: A | Discharge status: Home / Self Care | Payer: Medicare Other | Attending: Emergency Medicine | Admitting: Emergency Medicine

## 2013-09-06 ENCOUNTER — Encounter (HOSPITAL_COMMUNITY): Payer: Self-pay | Admitting: Emergency Medicine

## 2013-09-06 DIAGNOSIS — W1809XA Striking against other object with subsequent fall, initial encounter: Secondary | ICD-10-CM | POA: Insufficient documentation

## 2013-09-06 DIAGNOSIS — Y939 Activity, unspecified: Secondary | ICD-10-CM | POA: Insufficient documentation

## 2013-09-06 DIAGNOSIS — S0990XA Unspecified injury of head, initial encounter: Secondary | ICD-10-CM | POA: Insufficient documentation

## 2013-09-06 DIAGNOSIS — W19XXXA Unspecified fall, initial encounter: Secondary | ICD-10-CM

## 2013-09-06 DIAGNOSIS — E869 Volume depletion, unspecified: Secondary | ICD-10-CM | POA: Insufficient documentation

## 2013-09-06 DIAGNOSIS — M810 Age-related osteoporosis without current pathological fracture: Secondary | ICD-10-CM | POA: Insufficient documentation

## 2013-09-06 DIAGNOSIS — N289 Disorder of kidney and ureter, unspecified: Secondary | ICD-10-CM | POA: Insufficient documentation

## 2013-09-06 DIAGNOSIS — F411 Generalized anxiety disorder: Secondary | ICD-10-CM | POA: Insufficient documentation

## 2013-09-06 DIAGNOSIS — Z8679 Personal history of other diseases of the circulatory system: Secondary | ICD-10-CM | POA: Insufficient documentation

## 2013-09-06 DIAGNOSIS — Y92009 Unspecified place in unspecified non-institutional (private) residence as the place of occurrence of the external cause: Secondary | ICD-10-CM | POA: Insufficient documentation

## 2013-09-06 DIAGNOSIS — Z79899 Other long term (current) drug therapy: Secondary | ICD-10-CM | POA: Insufficient documentation

## 2013-09-06 DIAGNOSIS — F17200 Nicotine dependence, unspecified, uncomplicated: Secondary | ICD-10-CM | POA: Insufficient documentation

## 2013-09-06 DIAGNOSIS — H409 Unspecified glaucoma: Secondary | ICD-10-CM | POA: Insufficient documentation

## 2013-09-06 DIAGNOSIS — Z792 Long term (current) use of antibiotics: Secondary | ICD-10-CM | POA: Insufficient documentation

## 2013-09-06 LAB — URINALYSIS, ROUTINE W REFLEX MICROSCOPIC
Hgb urine dipstick: NEGATIVE
Ketones, ur: NEGATIVE mg/dL
Leukocytes, UA: NEGATIVE
Nitrite: NEGATIVE
Protein, ur: NEGATIVE mg/dL
Specific Gravity, Urine: 1.024 (ref 1.005–1.030)
pH: 6 (ref 5.0–8.0)

## 2013-09-06 LAB — CBC WITH DIFFERENTIAL/PLATELET
Basophils Absolute: 0 10*3/uL (ref 0.0–0.1)
Basophils Relative: 1 % (ref 0–1)
Eosinophils Absolute: 0 10*3/uL (ref 0.0–0.7)
Eosinophils Relative: 1 % (ref 0–5)
HCT: 37.9 % (ref 36.0–46.0)
Hemoglobin: 12.6 g/dL (ref 12.0–15.0)
Lymphocytes Relative: 11 % — ABNORMAL LOW (ref 12–46)
MCH: 30.1 pg (ref 26.0–34.0)
MCHC: 33.2 g/dL (ref 30.0–36.0)
Monocytes Absolute: 0.5 10*3/uL (ref 0.1–1.0)
Monocytes Relative: 8 % (ref 3–12)
Neutrophils Relative %: 80 % — ABNORMAL HIGH (ref 43–77)
Platelets: 213 10*3/uL (ref 150–400)
RDW: 13.9 % (ref 11.5–15.5)

## 2013-09-06 LAB — COMPREHENSIVE METABOLIC PANEL
ALT: 19 U/L (ref 0–35)
Albumin: 3.7 g/dL (ref 3.5–5.2)
Alkaline Phosphatase: 76 U/L (ref 39–117)
BUN: 31 mg/dL — ABNORMAL HIGH (ref 6–23)
Calcium: 9.7 mg/dL (ref 8.4–10.5)
Creatinine, Ser: 1.38 mg/dL — ABNORMAL HIGH (ref 0.50–1.10)
GFR calc Af Amer: 40 mL/min — ABNORMAL LOW (ref >90)
Glucose, Bld: 105 mg/dL — ABNORMAL HIGH (ref 70–99)
Potassium: 3.9 mEq/L (ref 3.5–5.1)
Total Protein: 6.1 g/dL (ref 6.0–8.3)

## 2013-09-06 MED ORDER — SODIUM CHLORIDE 0.9 % IV SOLN
1000.0000 mL | Freq: Once | INTRAVENOUS | Status: AC
  Administered 2013-09-06: 1000 mL via INTRAVENOUS

## 2013-09-06 MED ORDER — SODIUM CHLORIDE 0.9 % IV SOLN
1000.0000 mL | INTRAVENOUS | Status: DC
  Administered 2013-09-06: 1000 mL via INTRAVENOUS

## 2013-09-06 NOTE — ED Notes (Signed)
Pt was assisted to the bathroom with 2 people assist.

## 2013-09-06 NOTE — ED Notes (Addendum)
Pt is a resident of [facility]. Pt had a fall yesterday and has left sided flank pain, above the hip. Pt has a history of osteoporosis. Family reports increased weakness and poor PO intake, which began two weeks ago. Family reports the pt was recently started on cipro for a UTI infection. Family reports cognitive decline and social decline over the past two weeks.

## 2013-09-06 NOTE — ED Provider Notes (Addendum)
CSN: [id number]     Arrival date & time 09/06/13  1228 History   First MD Initiated Contact with Patient 09/06/13 1237     Chief Complaint  Patient presents with  . Fall   HPI Patient presents with a fall 2 days ago.  She states this felt generally fell back and struck the left side of her head.  She denies any weakness of her arms or legs.  She denies neck pain.  Fevers or chills.  No nausea or vomiting.  She denies diarrhea.  Chest pain shortness of breath.  Abdominal pain.  Family is concerned that she seems as though she's been slightly more weak over the past 7-10 days.  She's had mild decreased oral intake.  The patient admits no appetite.  She denies other complaints except mild left-sided headache at this time   Past Medical History  Diagnosis Date  . Spinal stenosis     mild on back MRI, see eval 2007 and 2008 for r leg gait problem ? dyskinesia, MRI chronic changes  . Right bundle branch block   . Pain in thoracic spine   . Abnormality of gait   . Anxiety   . Pain in joint, lower leg   . Unspecified glaucoma(365.9)   . Migraine without aura, without mention of intractable migraine without mention of status migrainosus   . Osteoporosis, unspecified   . Palpitations    Past Surgical History  Procedure Laterality Date  . Tonsillectomy    . Cataract extraction      bilateral  . Eye surgery    . Electromagnetic navigation brochoscopy  05/03/2012    Procedure: ELECTROMAGNETIC NAVIGATION BRONCHOSCOPY;  Surgeon: Oretha Milch, MD;  Location: [facility] OR;  Service: Thoracic;  Laterality: Right;  Video bronchoscopy with endobronchial navigation  . Bronchoscopy    . Repair extensor tendon Right 04/10/2013    Procedure: CENTRALIZATION EXTENSOR TENDONS RIGHT MIDDLE FINGER, RIGHT RING FINGER, RIGHT SMALL FINGER;  Surgeon: Nicki Reaper, MD;  Location: [facility];  Service: Orthopedics;  Laterality: Right;   Family History  Problem Relation Age of Onset  . Osteoarthritis Mother    . Kidney cancer Father    History  Substance Use Topics  . Smoking status: Current Every Day Smoker -- 50 years    Types: Cigarettes  . Smokeless tobacco: Never Used     Comment: 2-5 cigs a day--smoked off and on  . Alcohol Use: Yes     Comment: occ   OB History   Grav Para Term Preterm Abortions TAB SAB Ect Mult Living   3 3             Review of Systems  All other systems reviewed and are negative.    Allergies  Codeine phosphate  Home Medications   Current Outpatient Rx  Name  Route  Sig  Dispense  Refill  . beta carotene w/minerals (OCUVITE) tablet   Oral   Take 1 tablet by mouth daily.         Marland Kitchen CALCIUM CARBONATE PO   Oral   Take 1 tablet by mouth daily.         . ciprofloxacin (CIPRO) 500 MG tablet   Oral   Take 500 mg by mouth 2 (two) times daily.          Marland Kitchen ibuprofen (ADVIL,MOTRIN) 200 MG tablet   Oral   Take 200 mg by mouth every 6 (six) hours as needed for pain.         Marland Kitchen  Multiple Vitamin (MULTIVITAMIN WITH MINERALS) TABS   Oral   Take 1 tablet by mouth daily.         . sertraline (ZOLOFT) 50 MG tablet   Oral   Take 50 mg by mouth daily.         . traMADol (ULTRAM) 50 MG tablet   Oral   Take by mouth 2 (two) times daily.         . travoprost, benzalkonium, (TRAVATAN Z) 0.004 % ophthalmic solution   Both Eyes   Place 1 drop into both eyes at bedtime.          . vitamin B-12 (CYANOCOBALAMIN) 100 MCG tablet   Oral   Take 50 mcg by mouth daily.           Marland Kitchen VITAMIN D, CHOLECALCIFEROL, PO   Oral   Take 1 capsule by mouth daily.         Marland Kitchen zinc gluconate 50 MG tablet   Oral   Take 50 mg by mouth daily.          BP 130/56  Pulse 72  Temp(Src) 98.3 F (36.8 C) (Oral)  Resp 18  SpO2 95% Physical Exam  Nursing note and vitals reviewed. Constitutional: She is oriented to person, place, and time. She appears well-developed and well-nourished. No distress.  HENT:  Head: Normocephalic and atraumatic.  Eyes: EOM are  normal.  Neck: Normal range of motion.  C-spine nontender.  C-spine cleared by Nexus criteria  Cardiovascular: Normal rate, regular rhythm and normal heart sounds.   Pulmonary/Chest: Effort normal and breath sounds normal.  Abdominal: Soft. She exhibits no distension. There is no tenderness.  Musculoskeletal: Normal range of motion.  Neurological: She is alert and oriented to person, place, and time.  Skin: Skin is warm and dry.  Psychiatric: She has a normal mood and affect. Judgment normal.    ED Course  Procedures (including critical care time) Labs Review Labs Reviewed  CBC WITH DIFFERENTIAL - Abnormal; Notable for the following:    Neutrophils Relative % 80 (*)    Lymphocytes Relative 11 (*)    All other components within normal limits  COMPREHENSIVE METABOLIC PANEL - Abnormal; Notable for the following:    Glucose, Bld 105 (*)    BUN 31 (*)    Creatinine, Ser 1.38 (*)    GFR calc non Af Amer 35 (*)    GFR calc Af Amer 40 (*)    All other components within normal limits  URINALYSIS, ROUTINE W REFLEX MICROSCOPIC   Imaging Review Dg Chest 2 View  09/06/2013   CLINICAL DATA:  Fall  EXAM: CHEST  2 VIEW  COMPARISON:  Chest CT 06/25/2013  FINDINGS: COPD with pulmonary hyperinflation. Negative for pneumonia or heart failure. Negative for pleural effusion.  Lung nodule right medial lung base appears unchanged.  Moderate compression fracture T12 appears unchanged.  IMPRESSION: COPD.  No acute cardiopulmonary abnormality.  Right lower lobe lung nodule is unchanged.   Electronically Signed   By: Marlan Palau M.D.   On: 09/06/2013 13:57   Ct Head Wo Contrast  09/06/2013   CLINICAL DATA:  Fall.  Weakness.  EXAM: CT HEAD WITHOUT CONTRAST  TECHNIQUE: Contiguous axial images were obtained from the base of the skull through the vertex without intravenous contrast.  COMPARISON:  04/27/2013.  FINDINGS: No skull fracture or intracranial hemorrhage.  Prominent small vessel disease type changes  without CT evidence of large acute infarct.  Atrophy. Ventricular prominence possibly  reflects result of central atrophy although hydrocephalus may also be present. Appearance unchanged.  No intracranial mass lesion noted on this unenhanced exam.  Vascular calcifications.  IMPRESSION: No skull fracture or intracranial hemorrhage.  Prominent small vessel disease type changes without CT evidence of large acute infarct.  Atrophy. Ventricular prominence possibly reflects result of central atrophy although hydrocephalus may also be present. Appearance unchanged.   Electronically Signed   By: Bridgett Larsson M.D.   On: 09/06/2013 14:16  I personally reviewed the imaging tests through PACS system I reviewed available ER/hospitalization records through the EMR   EKG Interpretation   None       MDM  No diagnosis found. The patient is well appearing.  She will need to followup with neurology given the possible ventriculomegaly.  She otherwise is well appearing and can be discharged home safely.  Very mild elevation of her BUN and creatinine as compared to prior this does not need to be evaluated this time as this is likely a prerenal.  Will need to be followed closely by her primary care physician.  A copy has been made to the PCP    Lyanne Co, MD 09/06/13 1550  Lyanne Co, MD 09/06/13 [phone]

## 2013-09-06 NOTE — ED Notes (Signed)
Per family pt has also had a decrease in her appetite and her fluid intake. States that she does not urinate often or has had decreased urinary output.

## 2013-09-10 ENCOUNTER — Telehealth: Payer: Self-pay | Admitting: Neurology

## 2013-09-10 NOTE — Telephone Encounter (Signed)
Patient has been sched/ confirmed 

## 2013-09-11 NOTE — Telephone Encounter (Signed)
Pt daughter has been notified.

## 2013-09-13 ENCOUNTER — Ambulatory Visit (INDEPENDENT_AMBULATORY_CARE_PROVIDER_SITE_OTHER): Payer: Medicare Other | Admitting: Neurology

## 2013-09-13 ENCOUNTER — Encounter (INDEPENDENT_AMBULATORY_CARE_PROVIDER_SITE_OTHER): Payer: Self-pay

## 2013-09-13 ENCOUNTER — Encounter: Payer: Self-pay | Admitting: Neurology

## 2013-09-13 VITALS — BP 134/73 | HR 77 | Resp 16 | Ht 61.0 in | Wt 93.0 lb

## 2013-09-13 DIAGNOSIS — F0280 Dementia in other diseases classified elsewhere without behavioral disturbance: Secondary | ICD-10-CM

## 2013-09-13 DIAGNOSIS — R410 Disorientation, unspecified: Secondary | ICD-10-CM

## 2013-09-13 HISTORY — DX: Disorientation, unspecified: R41.0

## 2013-09-13 MED ORDER — DONEPEZIL HCL 5 MG PO TABS: 5.0000 mg | ORAL_TABLET | Freq: Every day | ORAL | Status: DC

## 2013-09-13 NOTE — Progress Notes (Signed)
[facility]  Provider:  Melvyn Novas, M D  Referring Provider: Madelin Headings, MD Primary Care Physician:  Breanna Harp, MD    HPI:  Breanna Dillon is a 79 y.o. female  Is seen here as a referral  from Breanna Dillon and Breanna Dillon .  I had seen Breanna Dillon 1 last year mainly for ligation of a fall related injury and wrist abnormality, today she is here referred from a different source for different problem she was admitted to the [facility] as: Hospital just in early December after a fall 2 days earlier she had apparently several falls and had just moved earlier this year into a living facility. Obvious developments happened in the first month in a new environment ( she moved in November  2014) .  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 In that time she required sutures required people to help her dress based feed etc. it seems that she became rather depressed over the loss of independence. Within the assisted care facility she reportedly had decreased oral intake of fluids and foot a loss of appetite am of other chronic left-sided headache she appeared weaker and weaker,  had shortness of breath and chest pain. She was treated for a UTI with oral Cipro when the ED evaluated her.   Again she had fallen several times and one of the folds was straight on her back she struck the left side of her head.   The patient has a past medical history of spinal stenosis, right bundle branch block, thoracic spine pain try for plasty, abnormality of gait with high fall risk, anxiety joint pain glaucoma migraine osteoporosis and cardiac palpitations. In July 2014 she underwent a centralization extensor tendon of the right middle finger right ring finger and right small finger  by Breanna Dillon.  And has significantly improved and she was able to provide a good grip strengths for me. She also has a movements noted in her handwriting. She can't eat not by herself she gentles buttons has some difficulties wears fastening her safety restraints in  the car.  Her son-in-law, Dr.  Yetta Dillon had also noted her cognitive changes,  Impulsive anger, visual hallucinations, her long deceased father was written a christmas card, but she thought is was easter.   There is paranoia, medication could be poison? She sees children playing in the lobby of the new facility, always in late afternoon.     Reviewed labs from ED and CT scan of the head.   Review of Systems: Out of a complete 14 system review, the patient complains of only the following symptoms, and all other reviewed systems are negative. I am confused, I have never hear of all these things i am supposed to have done, loss of appetite.    History   Social History  . Marital Status: Widowed    Spouse Name: N/A    Number of Children: 3  . Years of Education: N/A   Occupational History  . retired    Social History Main Topics  . Smoking status: Former Smoker -- 50 years    Types: Cigarettes    Quit date: 07/14/2013  . Smokeless tobacco: Never Used     Comment: 2-5 cigs  a day--smoked off and on  . Alcohol Use: Yes     Comment: occ  . Drug Use: No  . Sexual Activity: Not on file   Other Topics Concern  . 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             Family History  Problem Relation Age of Onset  . Osteoarthritis Mother   . Kidney cancer Father     Past Medical History  Diagnosis Date  . Spinal stenosis     mild on back MRI, see eval 2007 and 2008 for r leg gait problem ?  dyskinesia, MRI chronic changes  . Right bundle branch block   . Pain in thoracic spine   . Abnormality of gait   . Anxiety   . Pain in joint, lower leg   . Unspecified glaucoma(365.9)   . Migraine without aura, without mention of intractable migraine without mention of status migrainosus   . Osteoporosis, unspecified   . Palpitations     Past Surgical History  Procedure Laterality Date  . Tonsillectomy    . Cataract extraction      bilateral  . Eye surgery    . Electromagnetic navigation brochoscopy  05/03/2012    Procedure: ELECTROMAGNETIC NAVIGATION BRONCHOSCOPY;  Surgeon: Breanna Milch, MD;  Location: [facility] OR;  Service: Thoracic;  Laterality: Right;  Video bronchoscopy with endobronchial navigation  . Bronchoscopy    . Repair extensor tendon Right 04/10/2013    Procedure: CENTRALIZATION EXTENSOR TENDONS RIGHT MIDDLE FINGER, RIGHT RING FINGER, RIGHT SMALL FINGER;  Surgeon: Breanna Reaper, MD;  Location: [facility];  Service: Orthopedics;  Laterality: Right;    Current Outpatient Prescriptions  Medication Sig Dispense Refill  . beta carotene w/minerals (OCUVITE) tablet Take 1 tablet by mouth daily.      Marland Kitchen CALCIUM CARBONATE PO Take 1 tablet by mouth daily.      Marland Kitchen ibuprofen (ADVIL,MOTRIN) 200 MG tablet Take 200 mg by mouth every 6 (six) hours as needed for pain.      . Multiple Vitamin (MULTIVITAMIN WITH MINERALS) TABS Take 1 tablet by mouth daily.      . sertraline (ZOLOFT) 50 MG tablet Take 50 mg by mouth daily.      . traMADol (ULTRAM) 50 MG tablet Take by mouth 2 (two) times daily.      . travoprost, benzalkonium, (TRAVATAN Z) 0.004 % ophthalmic solution Place 1 drop into both eyes at bedtime.       . vitamin B-12 (CYANOCOBALAMIN) 100 MCG tablet Take 50 mcg by mouth daily.        Marland Kitchen VITAMIN D, CHOLECALCIFEROL, PO Take 1 capsule by mouth daily.      Marland Kitchen zinc gluconate 50 MG tablet Take 50 mg by mouth daily.       No current facility-administered medications for this  visit.    Allergies as of 09/13/2013 - Review Complete 09/13/2013  Allergen Reaction Noted  . Codeine phosphate Nausea And Vomiting 10/13/2006    Vitals: BP 134/73  Pulse 77  Resp 16  Ht 5\' 1"  (1.549 m)  Wt 93 lb (42.185 kg)  BMI 17.58 kg/m2 Last Weight:  Wt Readings from Last  1 Encounters:  09/13/13 93 lb (42.185 kg)   Last Height:   Ht Readings from Last 1 Encounters:  09/13/13 5\' 1"  (1.549 m)    Physical exam:  General: The patient is awake, alert and appears not in acute distress. The patient is well groomed. Head: Normocephalic, atraumatic.  hunched. Neck is supple. Mallampati 1, neck circumference: 13 ,   Cardiovascular:  Regular rate and rhythm , without  murmurs or carotid bruit, and without distended neck veins. Respiratory: Lungs are clear to auscultation. Skin:  Without evidence of edema, or rash Trunk: BMI is below  Normal  Range.  Neurologic exam : The patient is awake and alert, oriented to place and time.  Memory subjective  described as intact.  There is a normal attention span & concentration ability. Her son in-law reports she is still confused   Speech is fluent without  dysarthria, dysphonia or aphasia.  Mood and affect are appropriate.  Cranial nerves: Pupils are equal and briskly reactive to light. Funduscopic exam without  evidence of pallor or edema. Extraocular movements  in  horizontal planes with coarse saccades, upward gaze restricted, downwards intact without nystagmus.  Visual fields by finger perimetry are intact. Hearing to finger rub intact.   Facial sensation intact to fine touch. Facial motor strength is symmetric and tongue and uvula move midline.  Motor exam:   There is an increased: Over the left biceps and at the left breast. There is mild cogwheeling with passive range of motion elbow extension flexion at the elbow. I not sure that this could not have been there for a long time already however it is not present over the right arm.  The patient's gait is slow but I recall that she has actually been more stable than she gets physical therapy at the facility. Sensory:  Fine touch, pinprick and vibration were tested in all extremities.  Proprioception is restricted as vibration sense is no longer present in either foot and is lacking at the ankle level.   Coordination: Rapid alternating movements in the fingers/hands deferred, given the born deformity and joint deformity at baseline. . Finger-to-nose maneuver tested and normal without evidence of ataxia, dysmetria or tremor. We used the thumb on the left.   Gait and station: Patient walks without assistive device , able to climb up to the exam table.  Deep tendon reflexes:  Present, slightly more brisk over left patella,   Assessment:  After physical and neurologic examination, review of laboratory studies, imaging, neurophysiology testing and pre-existing records, assessment is   1) Delirium in remission, the described delirium could have occurred after the head injury given at baseline mild memory impairment and cognitive dysfunction. The patient was acutely confused the mom and she moved into the new assisted living facility. Is not unusual however we have to eliminate medication as they additional calls as well as metabolic factors. I like for her not to take the ciprofloxacin anymore it is anyway but now discontinued I understand. Home she definitely needs a calorie count and her fluid volume measure technology house the and take in fluids sufficiently. I would like for the patient to not use any psychotropic medication is a good worsen a delirium.   The patient's CT scan didn't show any intracranial bleeds but it was done in the emergency room just to rule this out couldn't really indicate any details structural changes. No intracranial mass lesion was noted vascular calcifications are noted no skull fracture. Small vessel event the but  no large acute infarct date this is brain  atrophy expected in a patient in the eighth decade.  Chest x-ray was negative.  She has a long on lung nodule on the right medial lung base that appears unchanged to previous chest x-rays. Her lab tests showed a elevated creatinine and a higher neutrophil count and granulocyte count lower lymphocyte counts this would speak for a bacterial infection. The patient still is a smoker, sees dr Vassie Loll for SOB complaint.   of days  Plan:  Treatment plan and additional workup : use Seroquel  Prn for hallucination treatement, her son in law and I discussed the risk factors of non typical neuroleptic medication use in geriatric population.   Aricept can be used  At 5 mg daily.  MRI ordered and EEG,  TSH and T4,  Creatinine re-adressed- now that she is hydrated.

## 2013-09-14 ENCOUNTER — Other Ambulatory Visit: Payer: Self-pay | Admitting: Neurology

## 2013-09-14 LAB — COMPREHENSIVE METABOLIC PANEL
ALT: 23 IU/L (ref 0–32)
Alkaline Phosphatase: 82 IU/L (ref 39–117)
BUN/Creatinine Ratio: 16 (ref 11–26)
CO2: 25 mmol/L (ref 18–29)
Chloride: 103 mmol/L (ref 97–108)
Creatinine, Ser: 1.14 mg/dL — ABNORMAL HIGH (ref 0.57–1.00)
GFR calc Af Amer: 52 mL/min/{1.73_m2} — ABNORMAL LOW (ref >59)
Glucose: 90 mg/dL (ref 65–99)
Potassium: 4.2 mmol/L (ref 3.5–5.2)
Total Bilirubin: 0.3 mg/dL (ref 0.0–1.2)

## 2013-09-14 LAB — TSH+FREE T4: TSH: 1.13 u[IU]/mL (ref 0.450–4.500)

## 2013-09-14 NOTE — Telephone Encounter (Signed)
Rx faxed to [facility] at [phone]. °

## 2013-09-18 ENCOUNTER — Other Ambulatory Visit (INDEPENDENT_AMBULATORY_CARE_PROVIDER_SITE_OTHER): Payer: Medicare Other

## 2013-09-18 ENCOUNTER — Telehealth: Payer: Self-pay | Admitting: Neurology

## 2013-09-18 DIAGNOSIS — F0280 Dementia in other diseases classified elsewhere without behavioral disturbance: Secondary | ICD-10-CM

## 2013-09-18 NOTE — Telephone Encounter (Signed)
Patient's daughter called stating that patient has EEG this morning and patient will go to that. Patient's daughter wants Dr. Vickey Huger to be aware that patient had a TIA on Friday and her left side was affected and cannot walk with a walker as well as she used to. Patient's daughter wishes to be contacted if there are any questions about this.

## 2013-09-18 NOTE — Telephone Encounter (Signed)
Please advise 

## 2013-09-19 NOTE — Telephone Encounter (Signed)
Telephone [phone] , daughter Leeanne Deed  Called about her mother having TIAs. On Friday , affecting leg and arm on the left side.  Sudden change in cognitive level, delirium at assistant living residence. She is affect incontinent.  Lewy body.

## 2013-09-20 ENCOUNTER — Telehealth: Payer: Self-pay | Admitting: Neurology

## 2013-09-20 DIAGNOSIS — F0280 Dementia in other diseases classified elsewhere without behavioral disturbance: Secondary | ICD-10-CM

## 2013-09-20 MED ORDER — QUETIAPINE FUMARATE 25 MG PO TABS: 25.0000 mg | ORAL_TABLET | Freq: Every day | ORAL | Status: DC

## 2013-09-20 NOTE — Telephone Encounter (Signed)
Patient's daughter calling to follow up, please call.

## 2013-09-20 NOTE — Telephone Encounter (Signed)
Last OV says patient can use Seroquel prn hallucinations.   Dr Vickey Huger, could you please verify what strength the patient should be taking?  Thank you.

## 2013-09-20 NOTE — Telephone Encounter (Signed)
It appears the Rx was sent to [facility] in error, I have resent it to [facility].  I called the daughter back, she was not home.  Called cell, got no answer.  Left message.

## 2013-09-20 NOTE — Telephone Encounter (Signed)
Lynden Ang from [facility] pharmacy calling to state that she has not received the Seroquel script for patient. Please call.

## 2013-09-21 NOTE — Telephone Encounter (Signed)
I called and reviewed Dr. Oliva Bustard findings. Continue to do what you are doing to assure you mom is drinking fluids and eating. Patient is headed in the right direction. Daughter thank [place].

## 2013-09-21 NOTE — Telephone Encounter (Signed)
Message copied by [facility] on Fri Sep 21, 2013  3:52 PM ------      Message from: [facility], CARMEN      Created: Mon Sep 17, 2013  4:08 PM       Concerned about this patient with subacute delirium/ confused mental status for one month:  Creatinine has improved , renal function has improved , liver function is normal.        there is mild hypernatremia, low albumin. This may be related to hydration/ nutritional status. She is on the right way.        please let her daughter and son in law( Dr Regino Schultze) know the results. ------

## 2013-10-10 NOTE — Progress Notes (Signed)
[facility]  EEG (ELECTROENCEPHALOGRAM) REPORT   STUDY DATE:   09-18-2013  PATIENT NAME: Breanna Dillon, Breanna Dillon  DOB: 07/26/1930 MRN:   [id number]  ORDERING CLINICIAN: Melvyn Novasarmen Vignesh Willert, MD   TECHNOLOGIST: Gearldine ShownJones, Lorraine   TECHNIQUE: Electroencephalogram was recorded utilizing standard 10-20 system of lead placement and reformatted into average and bipolar montages.   RECORDING TIME: added length , 75 minutes  ACTIVATION:   strobe lights.   CLINICAL INFORMATION: Breanna Dillon moved recently ( 8 weeks )  to senior residence and became increasingly confused, paranoid and developed a delirium. The patient suffered a fall , became increasingly confused. CT ruled out intracranial bleed.  EEG showed a posterior dominant rhythm of 7.5 hertz and asymmetry of the hemispheric EEG activity. There is a possible  artefact over the R parietal region, where the patient had a hematoma.  Photic stimulation documented no entrainment , overall response to stimuli was sluggish.  The background rhythm was organized, but true epileptiform activity was not seen.   EKG documented NSR with 81 bpm .  FINDINGS: The finding of a hematoma and the confusion after her fall all would explain her delirium, without any EEG seizure activity.     IMPRESSION: abnormal EEG due to the right sided diffuse slowing, attributed to her hematoma. Called patients daughter on 09-20-13    Melvyn Novasarmen Irvine Glorioso , MD

## 2013-10-16 ENCOUNTER — Ambulatory Visit: Payer: Medicare Other

## 2013-10-16 ENCOUNTER — Ambulatory Visit (INDEPENDENT_AMBULATORY_CARE_PROVIDER_SITE_OTHER): Payer: Medicare Other | Admitting: Internal Medicine

## 2013-10-16 VITALS — BP 118/66 | HR 72 | Temp 98.5°F | Resp 18 | Wt 103.0 lb

## 2013-10-16 DIAGNOSIS — M25559 Pain in unspecified hip: Secondary | ICD-10-CM

## 2013-10-16 DIAGNOSIS — R110 Nausea: Secondary | ICD-10-CM

## 2013-10-16 LAB — POCT URINALYSIS DIPSTICK
BILIRUBIN UA: NEGATIVE
Blood, UA: NEGATIVE
GLUCOSE UA: NEGATIVE
Ketones, UA: NEGATIVE
Nitrite, UA: NEGATIVE
Protein, UA: NEGATIVE
SPEC GRAV UA: 1.02
Urobilinogen, UA: 0.2
pH, UA: 7

## 2013-10-16 LAB — POCT UA - MICROSCOPIC ONLY
Casts, Ur, LPF, POC: NEGATIVE
Crystals, Ur, HPF, POC: NEGATIVE
MUCUS UA: NEGATIVE
Yeast, UA: NEGATIVE

## 2013-10-16 NOTE — Progress Notes (Signed)
   Subjective:    Patient ID: Breanna SartoriusBarbara W Dillon, female    DOB: 06/28/1930, 80 y.o.   MRN: [id number]  HPI Patient has trouble with her hip.  Went to therapy last Friday and had a long work out and started having right hip pain. Has tried bengay, heating pad, and some tramadol that she had with no relief.   Denies any falls.  No trouble with bowels or bladder.  Has newly onset of shortness of breath with nausea for about one week.     Review of Systems     Objective:   Physical Exam    [facility] reading (PRIMARY) by  Dr.Maksymilian Mabey ddd ls spine worse on right, hips are ok. Results for orders placed in visit on 10/16/13  POCT URINALYSIS DIPSTICK      Result Value Range   Color, UA yellow     Clarity, UA cloudy     Glucose, UA neg     Bilirubin, UA neg     Ketones, UA neg     Spec Grav, UA 1.020     Blood, UA neg     pH, UA 7.0     Protein, UA neg     Urobilinogen, UA 0.2     Nitrite, UA neg     Leukocytes, UA Trace    POCT UA - MICROSCOPIC ONLY      Result Value Range   WBC, Ur, HPF, POC 0-2     RBC, urine, microscopic 0-1     Bacteria, U Microscopic trace     Mucus, UA neg     Epithelial cells, urine per micros 0-1     Crystals, Ur, HPF, POC neg     Casts, Ur, LPF, POC neg     Yeast, UA neg     Amorphous small          Assessment & Plan:  Hip and DDD pain right Nausea/Trial prilosec DC nsaids/Use tylenol for pain

## 2013-10-16 NOTE — Progress Notes (Signed)
   Subjective:    Patient ID: Breanna SartoriusBarbara W Dillon, female    DOB: 09/04/1930, 80 y.o.   MRN: [id number]  HPI    Review of Systems     Objective:   Physical Exam        Assessment & Plan:

## 2013-10-16 NOTE — Patient Instructions (Signed)
Back Pain, Adult Low back pain is very common. About 1 in 5 people have back pain.The cause of low back pain is rarely dangerous. The pain often gets better over time.About half of people with a sudden onset of back pain feel better in just 2 weeks. About 8 in 10 people feel better by 6 weeks.  CAUSES Some common causes of back pain include:  Strain of the muscles or ligaments supporting the spine.  Wear and tear (degeneration) of the spinal discs.  Arthritis.  Direct injury to the back. DIAGNOSIS Most of the time, the direct cause of low back pain is not known.However, back pain can be treated effectively even when the exact cause of the pain is unknown.Answering your caregiver's questions about your overall health and symptoms is one of the most accurate ways to make sure the cause of your pain is not dangerous. If your caregiver needs more information, he or she may order lab work or imaging tests (X-rays or MRIs).However, even if imaging tests show changes in your back, this usually does not require surgery. HOME CARE INSTRUCTIONS For many people, back pain returns.Since low back pain is rarely dangerous, it is often a condition that people can learn to manageon their own.   Remain active. It is stressful on the back to sit or stand in one place. Do not sit, drive, or stand in one place for more than 30 minutes at a time. Take short walks on level surfaces as soon as pain allows.Try to increase the length of time you walk each day.  Do not stay in bed.Resting more than 1 or 2 days can delay your recovery.  Do not avoid exercise or work.Your body is made to move.It is not dangerous to be active, even though your back may hurt.Your back will likely heal faster if you return to being active before your pain is gone.  Pay attention to your body when you bend and lift. Many people have less discomfortwhen lifting if they bend their knees, keep the load close to their bodies,and  avoid twisting. Often, the most comfortable positions are those that put less stress on your recovering back.  Find a comfortable position to sleep. Use a firm mattress and lie on your side with your knees slightly bent. If you lie on your back, put a pillow under your knees.  Only take over-the-counter or prescription medicines as directed by your caregiver. Over-the-counter medicines to reduce pain and inflammation are often the most helpful.Your caregiver may prescribe muscle relaxant drugs.These medicines help dull your pain so you can more quickly return to your normal activities and healthy exercise.  Put ice on the injured area.  Put ice in a plastic bag.  Place a towel between your skin and the bag.  Leave the ice on for 15-20 minutes, 03-04 times a day for the first 2 to 3 days. After that, ice and heat may be alternated to reduce pain and spasms.  Ask your caregiver about trying back exercises and gentle massage. This may be of some benefit.  Avoid feeling anxious or stressed.Stress increases muscle tension and can worsen back pain.It is important to recognize when you are anxious or stressed and learn ways to manage it.Exercise is a great option. SEEK MEDICAL CARE IF:  You have pain that is not relieved with rest or medicine.  You have pain that does not improve in 1 week.  You have new symptoms.  You are generally not feeling well. SEEK   IMMEDIATE MEDICAL CARE IF:   You have pain that radiates from your back into your legs.  You develop new bowel or bladder control problems.  You have unusual weakness or numbness in your arms or legs.  You develop nausea or vomiting.  You develop abdominal pain.  You feel faint. Document Released: 09/20/2005 Document Revised: 03/21/2012 Document Reviewed: 02/08/2011 [employer] Patient Information 2014 [employer].  

## 2013-10-17 ENCOUNTER — Other Ambulatory Visit: Payer: Self-pay | Admitting: Neurology

## 2013-10-18 ENCOUNTER — Telehealth: Payer: Self-pay | Admitting: Neurology

## 2013-10-18 NOTE — Telephone Encounter (Signed)
I called back.  Spoke with [facility].  They could not locate Aricept Rx.  I verified we did write Rx for 5mg  #30 + 1 refill on 12/11.  They will process Rx now.

## 2013-10-18 NOTE — Telephone Encounter (Signed)
[facility] [phone]) calling regarding refill for Aricept.  [employer] fax # [phone]

## 2013-11-14 ENCOUNTER — Ambulatory Visit (INDEPENDENT_AMBULATORY_CARE_PROVIDER_SITE_OTHER): Payer: Medicare Other | Admitting: Nurse Practitioner

## 2013-11-14 ENCOUNTER — Encounter: Payer: Self-pay | Admitting: Nurse Practitioner

## 2013-11-14 VITALS — BP 128/73 | HR 95 | Ht 63.0 in | Wt 96.0 lb

## 2013-11-14 DIAGNOSIS — F0280 Dementia in other diseases classified elsewhere without behavioral disturbance: Secondary | ICD-10-CM

## 2013-11-14 DIAGNOSIS — G3183 Dementia with Lewy bodies: Principal | ICD-10-CM

## 2013-11-14 MED ORDER — TRAZODONE HCL 50 MG PO TABS: 25.0000 mg | ORAL_TABLET | Freq: Every day | ORAL | Status: DC

## 2013-11-14 NOTE — Patient Instructions (Signed)
Continue Zoloft at current dose.  Discontinue Aricept due to weight loss.  Start Trazadone 25 mg at bedtime to help sleep.  Continue Seroquel for hallucinations.  We will check MRI brain since it was not ordered at last visit.   Follow up with Dr. Vickey Hugerohmeier in 6 months.  Dementia With Lewy Bodies Dementia with Lewy bodies is a common type of dementia that gets progressively worse with time. Typical characteristics include progressive worsening of mental function combined with 3 other features: seeing things that are not there (hallucinations), significant fluctuations in alertness and attention, and changes in movement similar to Parkinson's disease (rigid muscles, slowed movement, and tremors). Dementia with Lewy bodies has many similar symptoms that Parkinson's disease and Alzheimer's disease has. CAUSES The symptoms of dementia with Lewy bodies are caused by the buildup of Lewy bodies, which are proteins that build up in brain cells and affect mental function and movement. No one knows exactly what causes the buildup of Lewy bodies, but it may be related to Alzheimer's dementia or Parkinson's disease.  SYMPTOMS   Memory problems.  Confusion.  Reduced attention span.  Hallucinations.  False ideas about another person or situation (delusions).  Trouble moving (rigid muscles, slowed movement, and tremors).  Shuffling movements (gait).  Sleep problems (acting out dreams).  Fluctuating attention (periods of drowsiness or lethargy, long periods of time spent staring into space, or disorganized speech). DIAGNOSIS There is no specific test to diagnose dementia with Lewy bodies, but your caregiver will evaluate you based on your symptoms and physical exam. Your evaluation may also include:  Memory testing.  Lab tests.  Brain imaging (CT scan, MRI).  Electroencephalogram (EEG).  Lumbar puncture. TREATMENT  There is no cure for dementia with Lewy bodies. Medicines may be  prescribed to help with mental function and movement.  HOME CARE INSTRUCTIONS The care of individuals with dementia is varied and dependent upon the progression of the dementia. The following suggestions are intended for the person living with, or caring for, the person with dementia.  Create a safe environment.  Remove the locks on bathroom doors to prevent the person from accidentally locking himself or herself in.  Use childproof latches on kitchen cabinets and any place where cleaning supplies, chemicals, or alcohol are kept.  Use childproof covers in unused electrical outlets.  Install childproof devices to keep doors and windows secured.  Remove stove knobs or install safety knobs and an automatic shut-off on the stove.  Lower the temperature on water heaters.  Label medicines and keep them locked up.  Secure knives, lighters, matches, power tools, and guns, and keep these items out of reach.  Keep the house free from clutter. Remove rugs or anything that might contribute to a fall.  Remove objects that might break and hurt the person.  Make sure lighting is good, both inside and outside.  Install grab rails as needed.  Use a monitoring device to alert you to falls or other needs for help.  Reduce confusion.  Keep familiar objects and people around.  Use night lights or dim lights at night.  Label items or areas.  Use reminders, notes, or directions for daily activities or tasks.  Keep a simple, consistent routine for waking, meals, bathing, dressing, and bedtime.  Create a calm, quiet environment.  Place large clocks and calendars prominently.  Display emergency numbers and the home address near all telephones.  Use cues to establish different times of the day. An example is to open curtains  to let the natural light in during the day.  Use effective communication.  Choose simple words and short sentences.  Use a gentle, calm tone of voice.  Be careful  not to interrupt.  If the person is struggling to find a word or communicate a thought, try to provide the word or thought.  Ask one question at a time. Allow the person ample time to answer questions. Repeat the question again if the person does not respond.  Reduce nighttime restlessness.  Provide a comfortable bed.  Have a consistent nighttime routine.  Ensure a regular walking or physical activity schedule. Involve the person in daily activities as much as possible.  Limit napping during the day.  Limit caffeine.  Attend social events that stimulate rather than overwhelm the senses.  Encourage good nutrition and hydration.  Reduce distractions during meal times and snacks.  Avoid foods that are too hot or too cold.  Monitor chewing and swallowing ability.  Continue with routine vision, hearing, dental, and medical screenings.  Only give over-the-counter or prescription medicines as directed by the caregiver.  Monitor driving abilities. Do not allow the person to drive when safe driving is no longer possible.  Register with an identification program which could provide location assistance in the event of a missing person situation. SEEK MEDICAL CARE IF:  New behavioral problems develop, such as moodiness or aggressiveness.  Any new problem with brain function develop, such as balance, speech, or falling a lot.  Problems with swallowing develop. Small changes or worsening in any aspect of brain function can be a sign that the illness is getting worse. It can also be a sign of another medical illness such as infection. Seeing a caregiver right away is important.  SEEK IMMEDIATE MEDICAL CARE IF:  A fever develops.  Confusion develops or worsens.  New or worsened sleepiness develops or staying awake becomes difficult. Document Released: 06/12/2002 Document Revised: 12/13/2011 Document Reviewed: 05/17/2011 [facility] Patient Information 2014 [place], [place].

## 2013-11-14 NOTE — Progress Notes (Signed)
PATIENT: Breanna Dillon DOB: 08-09-1930   REASON FOR VISIT: follow up HISTORY FROM: patient  HISTORY OF PRESENT ILLNESS: Breanna Dillon is a 80 y.o. female Is seen here as a referral from Dr. Fabian Sharp and Dr.McGough .  I had seen Breanna Dillon 1 last year mainly for ligation of a fall related injury and wrist abnormality, today she is here referred from a different source for different problem she was admitted to the [facility]: Hospital just in early December after a fall 2 days earlier she had apparently several falls and had just moved earlier this year into a living facility. Obvious developments happened in the first month in a new environment ( she moved in November 2014) . 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 In that time she required sutures required people to help her dress based feed etc. it seems that she became rather depressed over the loss of independence. Within the assisted care facility she reportedly had decreased oral intake of fluids and foot a loss of appetite am of other chronic left-sided headache she appeared weaker and weaker, had shortness of breath and chest pain. She was treated for a UTI with oral Cipro when the ED evaluated her.  Again she had fallen several times and one of the folds was straight on her back she struck the left side of her head.  The patient has a past medical history of spinal stenosis, right bundle branch block, thoracic spine pain try for plasty, abnormality of gait with high fall risk, anxiety joint pain glaucoma migraine osteoporosis and cardiac palpitations. In July 2014 she underwent a centralization extensor tendon of the right middle finger right ring finger and right small finger by Dr. Merlyn Lot.  And has significantly improved and she  was able to provide a good grip strengths for me. She also has a movements noted in her handwriting. She can't eat not by herself she gentles buttons has some difficulties wears fastening her safety restraints in the car.  Her son-in-law, Dr. Yetta Numbers had also noted her cognitive changes, Impulsive anger, visual hallucinations, her long deceased father was written a christmas card, but she thought is was easter.  There is paranoia, medication could be poison? She sees children playing in the lobby of the new facility, always in late afternoon.  Reviewed labs from ED and CT scan of the head.   UPDATE 11/14/12 (LL):     Review of Systems:  Out of a complete 14 system review, the patient complains of only the following symptoms, and all other reviewed systems are negative.  "I am confused, I have never hear of all these things i am supposed to have done", loss of appetite    ALLERGIES: Allergies  Allergen Reactions  . Codeine Phosphate Nausea And Vomiting    Reaction occurred 30 years ago and may no longer be valid, per patient.    HOME MEDICATIONS: Outpatient Prescriptions Prior to Visit  Medication Sig Dispense Refill  . beta carotene w/minerals (OCUVITE) tablet Take 1 tablet by mouth daily.      Marland Kitchen CALCIUM CARBONATE PO Take 1 tablet by mouth daily.      . Multiple Vitamin (MULTIVITAMIN WITH MINERALS) TABS Take 1 tablet by mouth daily.      Marland Kitchen  QUEtiapine (SEROQUEL) 25 MG tablet TAKE ONE TABLET AT BEDTIME.  30 tablet  0  . sertraline (ZOLOFT) 50 MG tablet Take 50 mg by mouth daily.      . travoprost, benzalkonium, (TRAVATAN Z) 0.004 % ophthalmic solution Place 1 drop into both eyes at bedtime.       . vitamin B-12 (CYANOCOBALAMIN) 100 MCG tablet Take 50 mcg by mouth daily.        Marland Kitchen VITAMIN D, CHOLECALCIFEROL, PO Take 1 capsule by mouth daily.      Marland Kitchen zinc gluconate 50 MG tablet Take 50 mg by mouth daily.      Marland Kitchen donepezil (ARICEPT) 5 MG tablet Take 1 tablet (5 mg total) by mouth at  bedtime.  30 tablet  1  . traMADol (ULTRAM) 50 MG tablet Take by mouth 2 (two) times daily.       No facility-administered medications prior to visit.    PAST MEDICAL HISTORY: Past Medical History  Diagnosis Date  . Spinal stenosis     mild on back MRI, see eval 2007 and 2008 for r leg gait problem ? dyskinesia, MRI chronic changes  . Right bundle branch block   . Pain in thoracic spine   . Abnormality of gait   . Anxiety   . Pain in joint, lower leg   . Unspecified glaucoma   . Migraine without aura, without mention of intractable migraine without mention of status migrainosus   . Osteoporosis, unspecified   . Palpitations   . Subacute delirium 09/13/2013    PAST SURGICAL HISTORY: Past Surgical History  Procedure Laterality Date  . Tonsillectomy    . Cataract extraction      bilateral  . Eye surgery    . Electromagnetic navigation brochoscopy  05/03/2012    Procedure: ELECTROMAGNETIC NAVIGATION BRONCHOSCOPY;  Surgeon: Oretha Milch, MD;  Location: [facility] OR;  Service: Thoracic;  Laterality: Right;  Video bronchoscopy with endobronchial navigation  . Bronchoscopy    . Repair extensor tendon Right 04/10/2013    Procedure: CENTRALIZATION EXTENSOR TENDONS RIGHT MIDDLE FINGER, RIGHT RING FINGER, RIGHT SMALL FINGER;  Surgeon: Nicki Reaper, MD;  Location: [facility];  Service: Orthopedics;  Laterality: Right;    FAMILY HISTORY: Family History  Problem Relation Age of Onset  . Osteoarthritis Mother   . Kidney cancer Father     SOCIAL HISTORY: History   Social History  . Marital Status: Widowed    Spouse Name: N/A    Number of Children: 3  . Years of Education: N/A   Occupational History  . retired    Social History Main Topics  . Smoking status: Former Smoker -- 50 years    Types: Cigarettes    Quit date: 07/14/2013  . Smokeless tobacco: Never Used     Comment: 2-5 cigs a day--smoked off and on  . Alcohol Use: Yes     Comment: occ  . Drug Use: No  .  Sexual Activity: Not on file   Other Topics Concern  . 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              PHYSICAL EXAM  Filed Vitals:   11/14/13 1058  BP: 128/73  Pulse: 95  Height: 5\' 3"  (1.6 m)  Weight: 96 lb (43.545 kg)   Body mass index is 17.01 kg/(m^2).  [facility]  Provider: Melvyn Novasarmen Dohmeier, M D  Referring Provider: Madelin HeadingsPanosh, Wanda K, MD  Primary Care Physician: Lorretta HarpPANOSH,WANDA KOTVAN, MD  HPI: Breanna SartoriusBarbara W Dillon is a 80 y.o. female Is seen here as a referral from Dr. Fabian SharpPanosh and Dr.McGough .  I had seen Breanna Dillon 1 last year mainly for ligation of a fall related injury and wrist abnormality, today she is here referred from a different source for different problem she was admitted to the [facility] as: Hospital just in early December after a fall 2 days earlier she had apparently several falls and had just moved earlier this year into a living facility. Obvious developments happened in the first month in a new environment ( she moved in November 2014) . 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 In that time she required sutures and required people to help her dress based feed etc. it seems that she became rather depressed over the loss of independence. Within the assisted care facility she reportedly had decreased oral intake of fluids and a loss of appetite,chronic left-sided headache she appeared weaker and weaker, had shortness of breath and chest pain. She was treated for a UTI with oral Cipro when the ED evaluated her.  Again she  had fallen several times and one of the falls was straight on her back; she struck the left side of her head.  The patient has a past medical history of spinal stenosis, right bundle branch block, thoracic spine kyphoplasty, abnormality of gait with high fall risk, anxiety, joint pain glaucoma, migraine, osteoporosis and cardiac palpitations. In July 2014 she underwent a centralization extensor tendon of the right middle finger right ring finger and right small finger by Dr. Merlyn LotKuzma and has significantly improved and she was able to provide a good grip strengths for me. She also has  movements noted in her handwriting. She has some difficulties fastening her safety restraints in the car.  Her son-in-law, Dr. Yetta NumbersBill McGough had also noted her cognitive changes, Impulsive anger, visual hallucinations, her long deceased father had written a christmas card, but she thought is was easter.  There is paranoia, medication could be poison? She sees children playing in the lobby of the new facility, always in late afternoon.   UPDATE 11/14/13 (LL):  Breanna Dillon returns for revisit with her 2 daughters.  Since last visit, Dr. Vickey Hugerohmeier has had long discussion with patient's daughter about dementia, likely Lewy Body Dementia.  Breanna Dillon lives in an apartment now with an aide 12 hours a day.  The daughters explain that their mother has hyperactivity at times, she worries about where she has put something and will frantically call family members and look for it until she finds it.  She has excessive energy sometimes and other times is very tired.  She is very unstable on her feet, yet finds using the walker difficult due to her deformity of the left hand.  She is very agitated with her daughters and they argue about her loss of independence and the future of her care.    Review of Systems:  Out of a complete 14 system review,  the patient complains of only the following symptoms, and all other reviewed systems are negative.    loss of appetite, decreased vision, shortness of breath, daytime sleepiness, aching muscles, walking difficulty, memory loss, dizziness, weakness, agitation, confusion, decreased concentration, depression, anxiety, hyperactive  Current Outpatient Prescriptions on File Prior to Visit  Medication Sig Dispense Refill  . beta carotene w/minerals (OCUVITE) tablet Take 1 tablet by mouth daily.      Marland Kitchen CALCIUM CARBONATE PO Take 1 tablet by mouth daily.      . Multiple Vitamin (MULTIVITAMIN WITH MINERALS) TABS Take 1 tablet by mouth daily.      . QUEtiapine (SEROQUEL) 25 MG tablet TAKE ONE TABLET AT BEDTIME.  30 tablet  0  . sertraline (ZOLOFT) 50 MG tablet Take 50 mg by mouth daily.      . travoprost, benzalkonium, (TRAVATAN Z) 0.004 % ophthalmic solution Place 1 drop into both eyes at bedtime.       . vitamin B-12 (CYANOCOBALAMIN) 100 MCG tablet Take 50 mcg by mouth daily.        Marland Kitchen VITAMIN D, CHOLECALCIFEROL, PO Take 1 capsule by mouth daily.      Marland Kitchen zinc gluconate 50 MG tablet Take 50 mg by mouth daily.       No current facility-administered medications on file prior to visit.   Physical exam:  General: The patient is awake, alert and appears not in acute distress. The patient is well groomed.  Head: Normocephalic, atraumatic. hunched. Neck is supple. Mallampati 1, neck circumference: 13 ,  Cardiovascular: Regular rate and rhythm , without murmurs or carotid bruit, and without distended neck veins.  Respiratory: Lungs are clear to auscultation.  Skin: Without evidence of edema, or rash  Trunk: BMI is below Normal Range.  Neurologic exam :  The patient is awake and alert, oriented to place and time. Memory subjective described as poor.  MMSE score is 24/30 WITH DEFICITS IN ORIENTATION TO TIME, DELAYED RECALL 2/3, ATTENTION AND CALCULATION, COPYING A 3 DIMENSION FIGURE, CLOCK DRAWING 2/4.  GDS 5. AFT 8.  Speech is fluent without dysarthria, dysphonia or aphasia.  Mood and affect are appropriate.   Cranial nerves:  Pupils are equal and briskly reactive to light. Extraocular movements in horizontal planes with coarse saccades, upward gaze restricted, downwards intact without nystagmus.  Visual fields by finger perimetry are intact. Hearing to finger rub intact.  Facial sensation intact to fine touch. Facial motor strength is symmetric and tongue and uvula move midline.  Motor exam:  There is mild cogwheeling with passive range of motion elbow extension flexion at the elbow. I not sure that this could not have been there for a long time already however it is not present over the right arm.   Sensory: Fine touch, pinprick were tested in all extremities.  Proprioception is restricted as vibration sense is no longer present in either foot and is lacking at the ankle level.  Coordination: Rapid alternating movements in the fingers/hands deferred, given the joint deformity at baseline.  Finger-to-nose maneuver tested and normal without evidence of ataxia, dysmetria or tremor. We used the thumb on the left.  Gait and station: Patient walks with rolling walker, her movement is unsteady and slow. Deep tendon reflexes: Present, slightly more brisk over left patella   TSH and T4 normal.  Assessment: After physical and neurologic examination, review of laboratory studies, imaging, neurophysiology testing and pre-existing records, assessment is progressive dementia, likely Lewy Body Dementia due to the patient's history of  hallucinations, impulsivity, and gait problems - specifically rigidity of movement and instability.  She is very argumentative and agitated with her daughters who are present at the visit today. Her gait instability makes her a very high fall risk.  I recommended that 24 hour care be considered due to patient's frail condition and confusion at times.  Plan:  Continue Zoloft at current dose. Discontinue Aricept due to continued weight loss. Start Trazadone 25 mg at bedtime to help  sleep. Continue Seroquel for hallucinations. We will check MRI brain since it was not ordered at last visit. Follow up with Dr. Vickey Huger in 6 months, sooner as needed.  Orders Placed This Encounter  Procedures  . MR Brain Wo Contrast   Meds ordered this encounter  Medications  . traZODone (DESYREL) 50 MG tablet    Sig: Take 0.5 tablets (25 mg total) by mouth at bedtime.    Dispense:  15 tablet    Refill:  5    Order Specific Question:  Supervising Provider    Answer:  Vickey Huger, CARMEN [[id number]]   Return in about 6 months (around 05/14/2014).  Ronal Fear, MSN, NP-C 11/15/2013, 9:12 PM [facility] [address], [address] [place], [place] [address] [phone]  Note: This document was prepared with digital dictation and possible smart phrase technology. Any transcriptional errors that result from this process are unintentional.

## 2013-11-15 ENCOUNTER — Encounter: Payer: Self-pay | Admitting: Nurse Practitioner

## 2013-11-15 DIAGNOSIS — G3183 Dementia with Lewy bodies: Principal | ICD-10-CM

## 2013-11-15 DIAGNOSIS — F0280 Dementia in other diseases classified elsewhere without behavioral disturbance: Secondary | ICD-10-CM | POA: Insufficient documentation

## 2013-11-27 ENCOUNTER — Telehealth: Payer: Self-pay | Admitting: Internal Medicine

## 2013-11-27 NOTE — Telephone Encounter (Signed)
[facility] requesting refill of sertraline (ZOLOFT) 50 MG tablet

## 2013-11-28 ENCOUNTER — Telehealth: Payer: Self-pay | Admitting: Neurology

## 2013-11-28 MED ORDER — SERTRALINE HCL 50 MG PO TABS: 50.0000 mg | ORAL_TABLET | Freq: Every day | ORAL | Status: AC

## 2013-11-28 MED ORDER — ALPRAZOLAM 0.25 MG PO TABS: 0.2500 mg | ORAL_TABLET | Freq: Once | ORAL | Status: DC

## 2013-11-28 NOTE — Telephone Encounter (Signed)
Spoke to patient's daughter and relayed that doctor put in chart that she can have a sample pack of xanax before MRI.  She will call before coming to have xanax up front for pick up.  MRI is not until 12-17-13.

## 2013-11-28 NOTE — Telephone Encounter (Signed)
Sent to the pharmacy by e-scribe. 

## 2013-11-28 NOTE — Telephone Encounter (Signed)
Please advise.  Not had a recent physical.  Thanks!

## 2013-11-28 NOTE — Telephone Encounter (Signed)
Neurology evaluation in february advised continuing the zoloft . I am aware i havent seen her but she is seeing neuro specialists .  Ok to refill medication for 6 months

## 2013-11-28 NOTE — Telephone Encounter (Signed)
Patient is requesting a Rx for Valium to take prior to MRI.  Patient uses [facility].  Please advise,  Thank you.

## 2013-11-28 NOTE — Telephone Encounter (Signed)
Patient's daughter calling requesting prescription for Valium for her mother prior to the MRI. Please call to advise.

## 2013-12-05 NOTE — Telephone Encounter (Signed)
Please note that I will be out of office on 12-17-13 and would kindly ask the interpreting physician to relate the results of the MRI to Dr. Worthy KeelerMacGough and his mother in law- Mrs Breanna Dillon ( the patient)  Right after reading . EPIC has been set for my absence. Please note : I  will be back on 12-28-13 .

## 2013-12-05 NOTE — Telephone Encounter (Signed)
Ok for xanax. -VRP 

## 2013-12-17 ENCOUNTER — Ambulatory Visit
Admission: RE | Admit: 2013-12-17 | Discharge: 2013-12-17 | Disposition: A | Discharge status: Home / Self Care | Payer: Medicare Other | Source: Ambulatory Visit | Attending: Nurse Practitioner | Admitting: Nurse Practitioner

## 2013-12-17 DIAGNOSIS — R413 Other amnesia: Secondary | ICD-10-CM

## 2013-12-17 DIAGNOSIS — F0280 Dementia in other diseases classified elsewhere without behavioral disturbance: Secondary | ICD-10-CM

## 2013-12-17 DIAGNOSIS — G3183 Dementia with Lewy bodies: Principal | ICD-10-CM

## 2013-12-24 ENCOUNTER — Telehealth: Payer: Self-pay | Admitting: Neurology

## 2013-12-24 NOTE — Telephone Encounter (Signed)
Patient had an MRI last Monday--she is going to a new PCP today and would like results so can pass along to new doctor--please call

## 2013-12-24 NOTE — Telephone Encounter (Signed)
The brain scan showed a normal structure of the brain and mild to moderate volume loss which we call atrophy. There were changes in the deeper structures of the brain, which we call white matter changes or microvascular changes. These were reported as moderate to severe in Her case. These are tiny white spots, that occur with time and are seen in a variety of conditions, including with normal aging, chronic hypertension, chronic headaches, especially migraine HAs, chronic diabetes, chronic hyperlipidemia. There were areas of tiny hemorrhages which were old. This can be seen with underlying dementia conditions. Findings are in keeping with advanced dementia. No acute findings. Huston FoleySaima Tahjay Binion, MD, PhD [facility]

## 2013-12-24 NOTE — Telephone Encounter (Signed)
Pt requesting MRI results, Larita FifeLynn, NP [facility] Dr. Frances FurbishAthar. Please advise

## 2013-12-25 ENCOUNTER — Telehealth: Payer: Self-pay | Admitting: Neurology

## 2013-12-25 NOTE — Telephone Encounter (Signed)
Pt is requesting MRI results, See notes from 12/24/13 were Breanna Dillon called requesting for pt. Please call Breanna AlineMartha M. concerning this matter. Thanks

## 2013-12-25 NOTE — Telephone Encounter (Signed)
Called pt's daughter Johnny BridgeMartha to inform her of the MRI results.

## 2013-12-25 NOTE — Telephone Encounter (Signed)
Called pt and spoke with pt's daughter Breanna Dillon informing her per Breanna Dillon, that the pt's MRI showed a normal structure of the brain and mild to moderate volume loss which we call atrophy and that there were deeper structures of the brain, which we call white matter changes or microvascular changes, these were reported as moderate to severe in her case. I informed the daughter that these were tiny white spots that occur with time and are seen in a variety of conditions and that this can be seen with underlying dementia conditions. No acute findings. I advised the daughter that if the pt has any other problems, questions or concerns to call the office. Pt's daughter verbalized understanding.

## 2013-12-27 IMAGING — CT CT CHEST W/ CM
2 of 4 series · 15 of 36 positions shown, 18 images · IV contrast (Omnipaque 300)
Comparison: 12/11/2012 and previous back to 03/21/2012.

CLINICAL DATA: Followup of right lower lobe pulmonary nodule.

EXAM:
CT CHEST WITH CONTRAST
TECHNIQUE: Multidetector CT imaging of the chest was performed during
intravenous contrast administration.
CONTRAST:  80mL OMNIPAQUE IOHEXOL 300 MG/ML  SOLN

[Series 2: chest routine with · axial · 0.58mm/px · z∈[-67,+208]mm · 12 of 67 slices shown, 15 images]
[im 6/67  mediastinal]
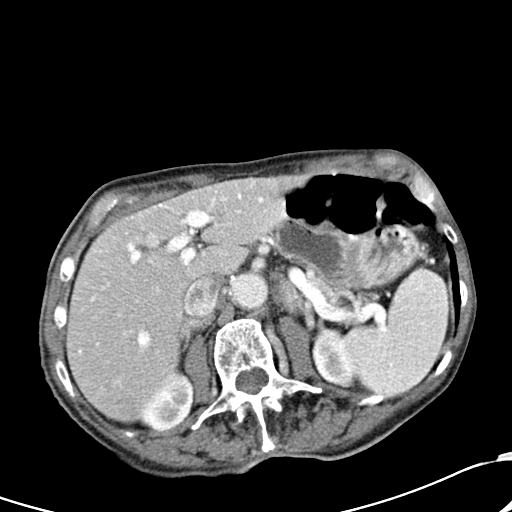
[im 6/67  lung]
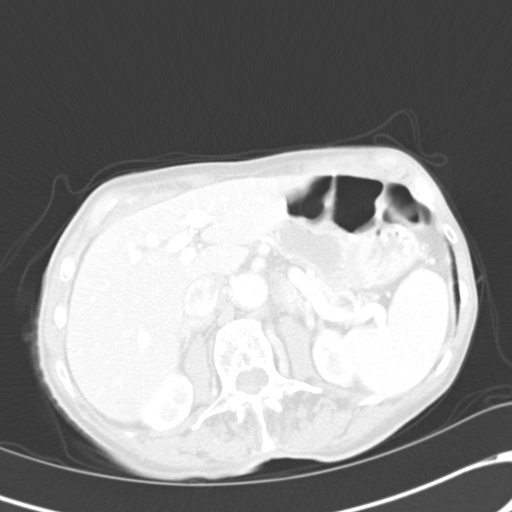
[im 11/67  lung]
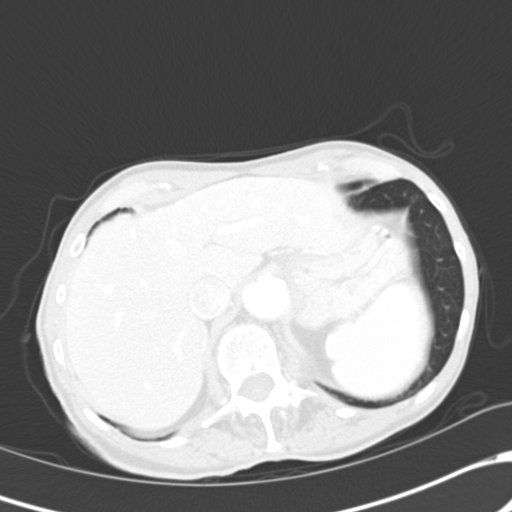
[im 16/67  lung]
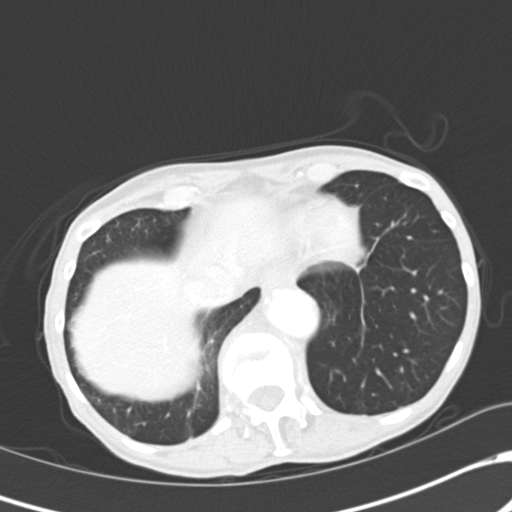
[im 21/67  lung]
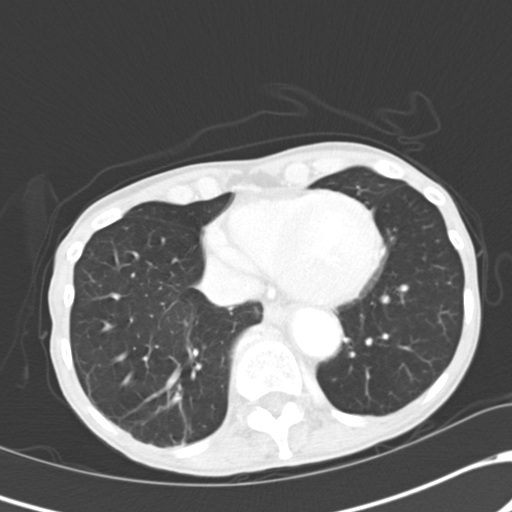
[im 26/67  mediastinal]
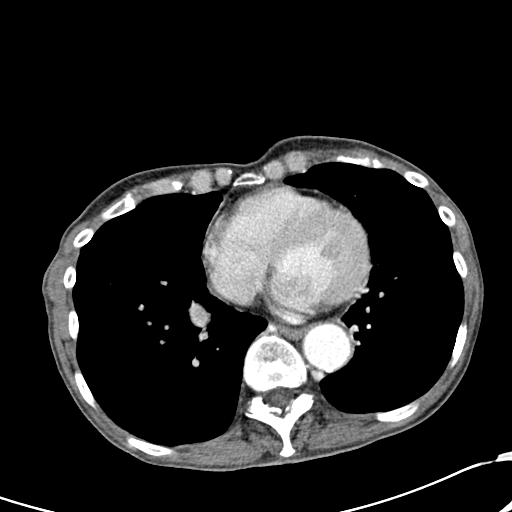
[im 26/67  lung]
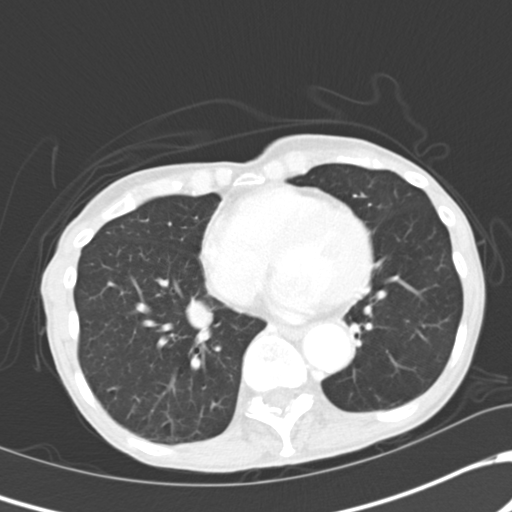
[im 31/67  lung]
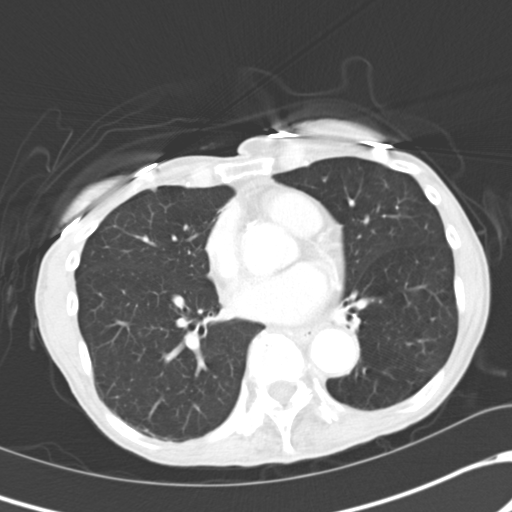
[im 36/67  lung]
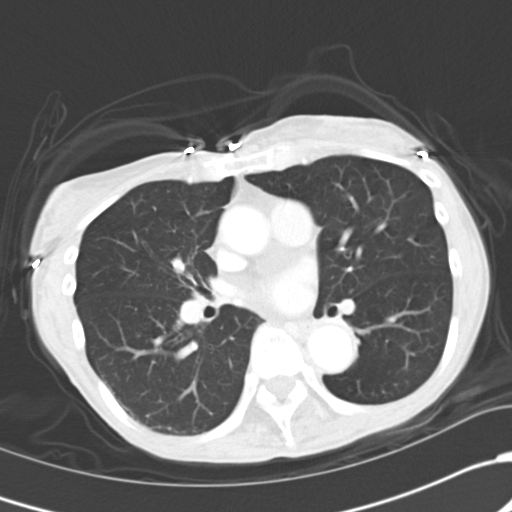
[im 41/67  lung]
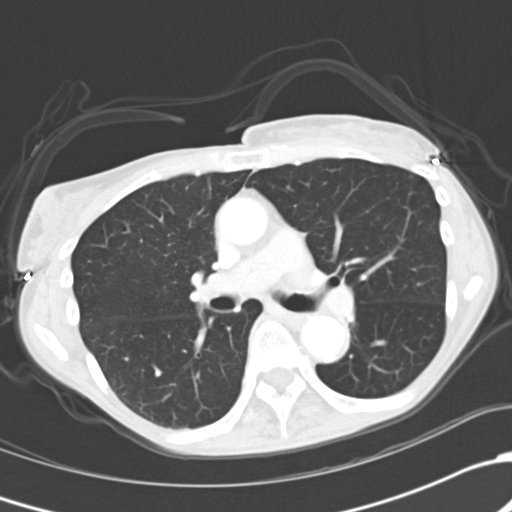
[im 46/67  mediastinal]
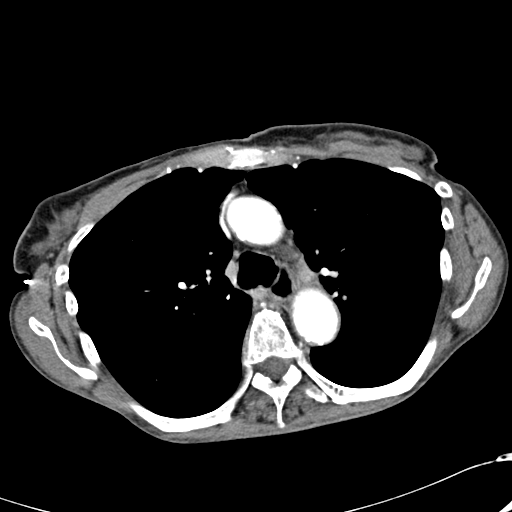
[im 46/67  lung]
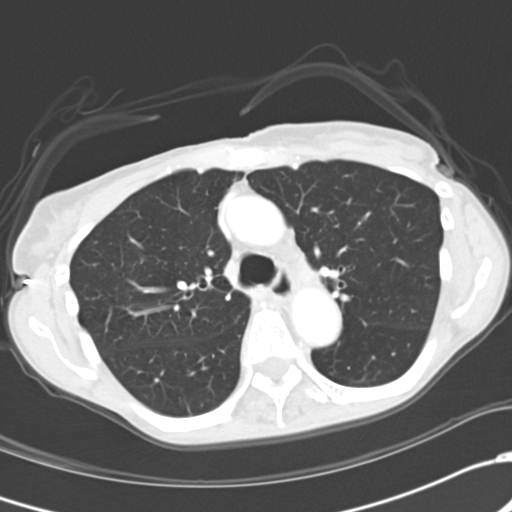
[im 51/67  lung]
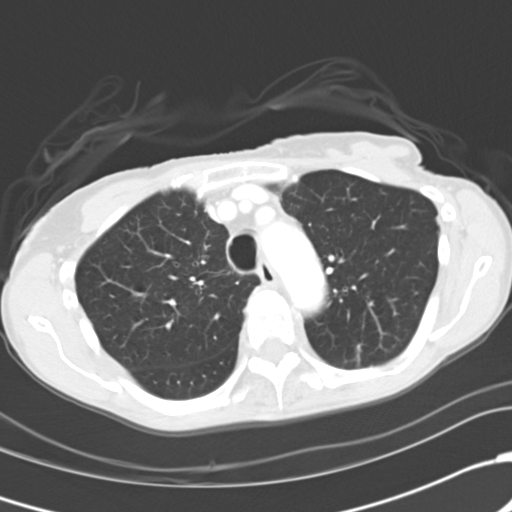
[im 56/67  lung]
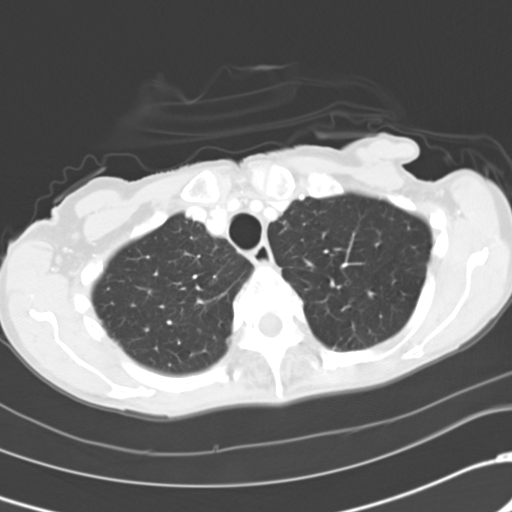
[im 61/67  lung]
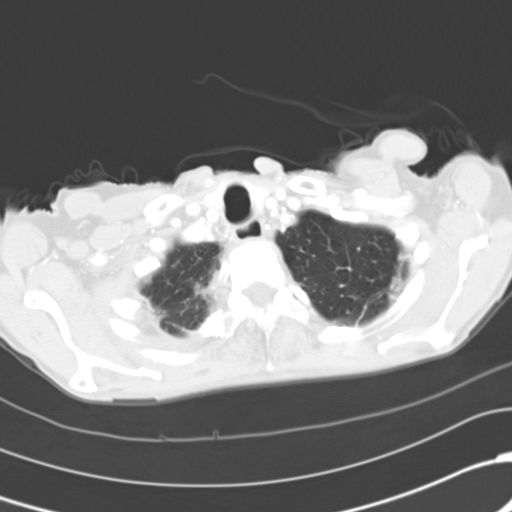

[Series 602: cor · coronal · 0.65mm/px · 3 of 106 slices shown]
[im 22/106  lung]
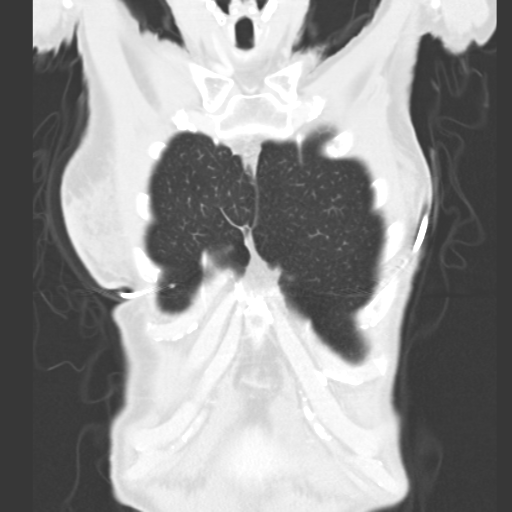
[im 43/106  lung]
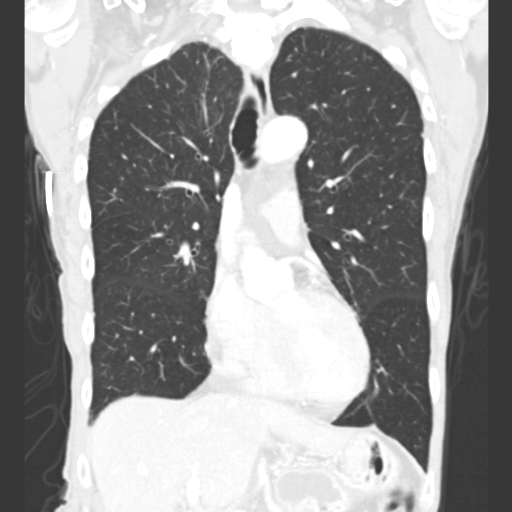
[im 64/106  lung]
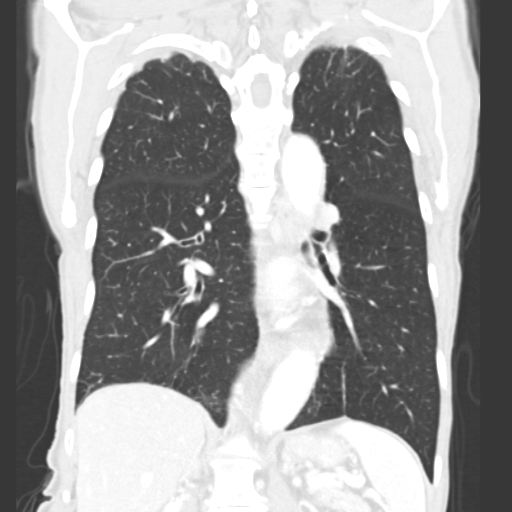

[15 of 36 positions shown; findings below may reference images not displayed]

FINDINGS: Lungs/Pleura: Mild centrilobular emphysema. Mild biapical
pleural-parenchymal scarring.

Well-circumscribed right lower lobe lung nodule which measures 2.0 x
1.7 cm on image 44/ series [DATE] cm craniocaudal on sagittal image
47. Unchanged. No new or enlarging nodules.

No pleural fluid.

Heart/Mediastinum: Aortic and branch vessel atherosclerosis. Normal
heart size, with trace pericardial fluid or thickening.

Coronary artery atherosclerosis.

No mediastinal or hilar adenopathy.

Upper abdomen: No significant findings.

Bones/Musculoskeletal: Moderate compression deformity involving the
T12 vertebral body. Ventral canal encroachment of approximately 7
mm. Grossly similar to MRI of 06/08/2013. .
IMPRESSION: 1. Similar size of a right lower lobe lung nodule, including back to
03/21/2012. This suggests a benign or indolent process. Recommend CT
surveillance until Tuesday March, 2014 to confirm benign etiology.
2. Compression deformity at T12, as detailed on lumbar spine MRI of
06/08/2013.
[DATE]. Atherosclerosis, including within the coronary arteries.

## 2014-01-15 ENCOUNTER — Encounter: Payer: Self-pay | Admitting: Internal Medicine

## 2014-01-15 ENCOUNTER — Ambulatory Visit (INDEPENDENT_AMBULATORY_CARE_PROVIDER_SITE_OTHER): Payer: Medicare Other | Admitting: Internal Medicine

## 2014-01-15 VITALS — BP 132/80 | HR 68 | Temp 98.2°F | Ht 65.0 in | Wt 97.4 lb

## 2014-01-15 DIAGNOSIS — J449 Chronic obstructive pulmonary disease, unspecified: Secondary | ICD-10-CM

## 2014-01-15 NOTE — Progress Notes (Signed)
Subjective:    Patient ID: Breanna SartoriusBarbara W Dillon, female    DOB: 01/25/1930, 80 y.o.   MRN: [id number]  HPI  PCP - Panosh  Son-in law- Dr. Karleen HampshireWilliam McGough [phone] (c), [phone] (o), [phone] (h)   80 y.o heavy smoker for FU of COPD - gold A&  RLL nodule.   She went in for a preventive visit and follow-up of trigger finger on her left making it hard to grip.  Chest xray showed Partially calcified oval lesion in the right infrahilar region.  Ct chest 03/21/12 showed a tiny subpleural nodule in the left upper lobe which measures 3.3 mm. Within the right upper lobe there was a 2.5 mm nodule. There was a large well circumscribed pulmonary nodule in the right lower lobe which measured 2.2 x 1.8 cm.  On PET there was some low level hypermetabolic activity (SUVmax = 4.3). No other sites of hypermetabolic activity identified within the neck, chest, abdomen or pelvis were noted.  PFTs showed surprisingly preserved FEV1 at 75% post BD but DLCO was decreased at 38%  ENB biopsies neg, able to navigate within 2 cm of lesion    07/24/2013  Chief Complaint  Patient presents with  . pulmonary nodule    follow up:  review CT done 06/25/13,SOB same,no cough,feels like hrt. beating fast at times,denies wheezing,no cp,no appetite   Underwent surgery on right hand.  Had a fall, now in a back brace CT chest 06/2013 -unchanged size of RLL nodule since 6/13 , Compression deformity at T12 Pt has been having difficulty with balance and muscle weakness-having multiple falls.  Smokes 1-2 cigs/d  OV 01/15/2014  Chief Complaint  Patient presents with  . Acute Visit    RA pt. Increase work in breathing. Increase SOB with activity. Mild dry cough. Denies CP.    Pulmonary problems - Gold stage II COPD with FEV1 1.5 L/75% with significant BD response. This FEV1 is a post bronchodilator FEV1, DLCO 38%  - Also right lower lobe nodule with nondiagnostic navigational bronchoscopy 2013 and stable nodule as of September  2014. High pretest already for benign nodule  : She presents acutely with her daughter but she clearly states that her problems are chronic and that her family is more concerned than she is. The last few months has insidious onset of shortness of breath that she categorically states is new. It is progressive. Dyspnea is brought on by exertion such as walking down the hallway with a walker or sometimes even talking. Dyspnea is relieved by rest. There is no associated chest pain or orthopnea or edema or paroxysmal nocturnal dyspnea or cough or sputum. Symptoms are rated as moderate in intensity without any radiation. Of note, she says that she is not on any inhaler therapy. She has bad deformities of her fingers and she feels even nebulizer use might be difficult as well as any inhaler that involves the pump. Clearly symptoms are not acute and not consistent with an exacerbation     has a past medical history of Spinal stenosis; Right bundle branch block; Pain in thoracic spine; Abnormality of gait; Anxiety; Pain in joint, lower leg; Unspecified glaucoma; Migraine without aura, without mention of intractable migraine without mention of status migrainosus; Osteoporosis, unspecified; Palpitations; and Subacute delirium (09/13/2013).   has past surgical history that includes Tonsillectomy; Cataract extraction; Eye surgery; Electormagnetic navigation bronchoscopy (05/03/2012); Bronchoscopy; and Repair extensor tendon (Right, 04/10/2013).    Review of Systems  Constitutional: Negative for fever  and unexpected weight change.  HENT: Positive for congestion. Negative for dental problem, ear pain, nosebleeds, postnasal drip, rhinorrhea, sinus pressure, sneezing, sore throat and trouble swallowing.   Eyes: Negative for redness and itching.  Respiratory: Positive for cough and shortness of breath. Negative for chest tightness and wheezing.   Cardiovascular: Negative for palpitations and leg swelling.   Gastrointestinal: Negative for nausea and vomiting.  Genitourinary: Negative for dysuria.  Musculoskeletal: Negative for joint swelling.  Skin: Negative for rash.  Neurological: Negative for headaches.  Hematological: Does not bruise/bleed easily.  Psychiatric/Behavioral: Negative for dysphoric mood. The patient is not nervous/anxious.        Objective:   Physical Exam  Vitals reviewed. Constitutional: She is oriented to person, place, and time. She appears well-developed and well-nourished. No distress.  Frail female sitting on a wheelchair cachectic but looks well  HENT:  Head: Normocephalic and atraumatic.  Right Ear: External ear normal.  Left Ear: External ear normal.  Mouth/Throat: Oropharynx is clear and moist. No oropharyngeal exudate.  Eyes: Conjunctivae and EOM are normal. Pupils are equal, round, and reactive to light. Right eye exhibits no discharge. Left eye exhibits no discharge. No scleral icterus.  Neck: Normal range of motion. Neck supple. No JVD present. No tracheal deviation present. No thyromegaly present.  Cardiovascular: Normal rate, regular rhythm, normal heart sounds and intact distal pulses.  Exam reveals no gallop and no friction rub.   No murmur heard. Pulmonary/Chest: Effort normal and breath sounds normal. No respiratory distress. She has no wheezes. She has no rales. She exhibits no tenderness.  Abdominal: Soft. Bowel sounds are normal. She exhibits no distension and no mass. There is no tenderness. There is no rebound and no guarding.  Musculoskeletal: Normal range of motion. She exhibits no edema and no tenderness.  Significant finger deformities of the left hand. Right hand looks okay  Sitting on wheelchair  Lymphadenopathy:    She has no cervical adenopathy.  Neurological: She is alert and oriented to person, place, and time. She has normal reflexes. No cranial nerve deficit. She exhibits normal muscle tone. Coordination normal.  Skin: Skin is warm  and dry. No rash noted. She is not diaphoretic. No erythema. No pallor.  Psychiatric: She has a normal mood and affect. Her behavior is normal. Judgment and thought content normal.    Filed Vitals:   01/15/14 1400  BP: 132/80  Pulse: 68  Temp: 98.2 F (36.8 C)  TempSrc: Oral  Height: 5\' 5"  (1.651 m)  Weight: 44.18 kg (97 lb 6.4 oz)  SpO2: 92%         Assessment & Plan:

## 2014-01-15 NOTE — Patient Instructions (Addendum)
It is possible that shortness of breath is related to obstructive lung disease and lack of inhaler therapy Start Advair 100/50 disc inhaler, one puff twice daily - learn technique from my CMA Return in 3-4 weeks to see Dr. Vassie LollAlva Spirometry at the time of followup to be done by Jerolyn Shinammy Wilson

## 2014-01-20 NOTE — Assessment & Plan Note (Signed)
It is possible that shortness of breath is related to obstructive lung disease and lack of inhaler therapy Start Advair 100/50 disc inhaler, one puff twice daily - learn technique from my CMA Return in 3-4 weeks to see Dr. Alva Spirometry at the time of followup to be done by Tammy Wilson 

## 2014-01-22 ENCOUNTER — Telehealth: Payer: Self-pay | Admitting: Neurology

## 2014-01-22 DIAGNOSIS — F03918 Unspecified dementia, unspecified severity, with other behavioral disturbance: Secondary | ICD-10-CM

## 2014-01-22 DIAGNOSIS — F0391 Unspecified dementia with behavioral disturbance: Secondary | ICD-10-CM

## 2014-01-22 MED ORDER — TRAZODONE HCL 50 MG PO TABS: 50.0000 mg | ORAL_TABLET | Freq: Every evening | ORAL | Status: DC | PRN

## 2014-01-22 NOTE — Telephone Encounter (Signed)
Patient currently takes one half tab of Trazodone 50mg  daily.  Daughter would like to know if the dose could be increased to one full tablet daily instead.  She says the patient is staying up all night.  Please advise.  Thank you.

## 2014-01-22 NOTE — Telephone Encounter (Signed)
Left VM with daughter and son in law  -  Dr. And Mrs McGough:  Yes , that is an option. I will increase the prescription . Consider increasing Seroquel instead of trazodone if you do not see the desired effect on her sleep -  CD

## 2014-01-22 NOTE — Telephone Encounter (Signed)
Patient's daughter Breanna BridgeMartha calling to ask about having patient take full tablet of Trazodone instead of half since patient now has a sitter with her 24 hours a day. Patient's daughter states patient is still getting up and staying busy at night. Please call and advise.

## 2014-01-23 ENCOUNTER — Encounter (HOSPITAL_COMMUNITY): Payer: Self-pay | Admitting: *Deleted

## 2014-01-23 ENCOUNTER — Emergency Department (HOSPITAL_COMMUNITY): Payer: Medicare Other

## 2014-01-23 ENCOUNTER — Inpatient Hospital Stay (HOSPITAL_COMMUNITY): Payer: Medicare Other

## 2014-01-23 ENCOUNTER — Inpatient Hospital Stay (HOSPITAL_COMMUNITY)
Admission: EM | Admit: 2014-01-23 | Discharge: 2014-02-01 | DRG: 064 | Disposition: E | Payer: Medicare Other | Attending: Neurology | Admitting: Neurology

## 2014-01-23 DIAGNOSIS — G9340 Encephalopathy, unspecified: Secondary | ICD-10-CM | POA: Diagnosis present

## 2014-01-23 DIAGNOSIS — R4020 Unspecified coma: Secondary | ICD-10-CM | POA: Diagnosis present

## 2014-01-23 DIAGNOSIS — I10 Essential (primary) hypertension: Secondary | ICD-10-CM | POA: Diagnosis present

## 2014-01-23 DIAGNOSIS — Z8261 Family history of arthritis: Secondary | ICD-10-CM

## 2014-01-23 DIAGNOSIS — Z66 Do not resuscitate: Secondary | ICD-10-CM | POA: Diagnosis present

## 2014-01-23 DIAGNOSIS — G936 Cerebral edema: Secondary | ICD-10-CM | POA: Diagnosis present

## 2014-01-23 DIAGNOSIS — R000 Tachycardia, unspecified: Secondary | ICD-10-CM | POA: Diagnosis present

## 2014-01-23 DIAGNOSIS — Z515 Encounter for palliative care: Secondary | ICD-10-CM

## 2014-01-23 DIAGNOSIS — F0280 Dementia in other diseases classified elsewhere without behavioral disturbance: Secondary | ICD-10-CM | POA: Diagnosis present

## 2014-01-23 DIAGNOSIS — F1010 Alcohol abuse, uncomplicated: Secondary | ICD-10-CM | POA: Diagnosis present

## 2014-01-23 DIAGNOSIS — Z681 Body mass index (BMI) 19 or less, adult: Secondary | ICD-10-CM

## 2014-01-23 DIAGNOSIS — R7309 Other abnormal glucose: Secondary | ICD-10-CM | POA: Diagnosis present

## 2014-01-23 DIAGNOSIS — R911 Solitary pulmonary nodule: Secondary | ICD-10-CM | POA: Diagnosis present

## 2014-01-23 DIAGNOSIS — M810 Age-related osteoporosis without current pathological fracture: Secondary | ICD-10-CM | POA: Diagnosis present

## 2014-01-23 DIAGNOSIS — I4510 Unspecified right bundle-branch block: Secondary | ICD-10-CM | POA: Diagnosis present

## 2014-01-23 DIAGNOSIS — J9600 Acute respiratory failure, unspecified whether with hypoxia or hypercapnia: Secondary | ICD-10-CM | POA: Diagnosis present

## 2014-01-23 DIAGNOSIS — I619 Nontraumatic intracerebral hemorrhage, unspecified: Principal | ICD-10-CM | POA: Diagnosis present

## 2014-01-23 DIAGNOSIS — I4891 Unspecified atrial fibrillation: Secondary | ICD-10-CM | POA: Diagnosis present

## 2014-01-23 DIAGNOSIS — Z9849 Cataract extraction status, unspecified eye: Secondary | ICD-10-CM

## 2014-01-23 DIAGNOSIS — Z87891 Personal history of nicotine dependence: Secondary | ICD-10-CM

## 2014-01-23 DIAGNOSIS — Z8051 Family history of malignant neoplasm of kidney: Secondary | ICD-10-CM

## 2014-01-23 DIAGNOSIS — J449 Chronic obstructive pulmonary disease, unspecified: Secondary | ICD-10-CM | POA: Diagnosis present

## 2014-01-23 DIAGNOSIS — J4489 Other specified chronic obstructive pulmonary disease: Secondary | ICD-10-CM | POA: Diagnosis present

## 2014-01-23 DIAGNOSIS — F411 Generalized anxiety disorder: Secondary | ICD-10-CM | POA: Diagnosis present

## 2014-01-23 DIAGNOSIS — G3183 Dementia with Lewy bodies: Secondary | ICD-10-CM

## 2014-01-23 DIAGNOSIS — H409 Unspecified glaucoma: Secondary | ICD-10-CM | POA: Diagnosis present

## 2014-01-23 HISTORY — DX: Chronic obstructive pulmonary disease, unspecified: J44.9

## 2014-01-23 HISTORY — DX: Dementia in other diseases classified elsewhere with behavioral disturbance: F02.81

## 2014-01-23 HISTORY — DX: Dementia in other diseases classified elsewhere, unspecified severity, with other behavioral disturbance: F02.818

## 2014-01-23 HISTORY — DX: Neurocognitive disorder with Lewy bodies: G31.83

## 2014-01-23 LAB — DIFFERENTIAL
BASOS ABS: 0.1 10*3/uL (ref 0.0–0.1)
Basophils Relative: 1 % (ref 0–1)
Eosinophils Absolute: 0.3 10*3/uL (ref 0.0–0.7)
Eosinophils Relative: 2 % (ref 0–5)
LYMPHS ABS: 5.4 10*3/uL — AB (ref 0.7–4.0)
Lymphocytes Relative: 41 % (ref 12–46)
Monocytes Absolute: 0.9 10*3/uL (ref 0.1–1.0)
Monocytes Relative: 7 % (ref 3–12)
NEUTROS ABS: 6.4 10*3/uL (ref 1.7–7.7)
Neutrophils Relative %: 49 % (ref 43–77)

## 2014-01-23 LAB — MRSA PCR SCREENING: MRSA BY PCR: NEGATIVE

## 2014-01-23 LAB — CBC
HCT: 41.9 % (ref 36.0–46.0)
Hemoglobin: 13 g/dL (ref 12.0–15.0)
MCH: 29.5 pg (ref 26.0–34.0)
MCHC: 31 g/dL (ref 30.0–36.0)
MCV: 95 fL (ref 78.0–100.0)
Platelets: 310 10*3/uL (ref 150–400)
RBC: 4.41 MIL/uL (ref 3.87–5.11)
RDW: 13.2 % (ref 11.5–15.5)
WBC: 13.1 10*3/uL — AB (ref 4.0–10.5)

## 2014-01-23 LAB — RAPID URINE DRUG SCREEN, HOSP PERFORMED
Amphetamines: NOT DETECTED
Barbiturates: NOT DETECTED
Benzodiazepines: NOT DETECTED
COCAINE: NOT DETECTED
OPIATES: NOT DETECTED
Tetrahydrocannabinol: NOT DETECTED

## 2014-01-23 LAB — URINALYSIS, ROUTINE W REFLEX MICROSCOPIC
Bilirubin Urine: NEGATIVE
Glucose, UA: NEGATIVE mg/dL
HGB URINE DIPSTICK: NEGATIVE
Ketones, ur: NEGATIVE mg/dL
Leukocytes, UA: NEGATIVE
Nitrite: NEGATIVE
PH: 6.5 (ref 5.0–8.0)
Protein, ur: 100 mg/dL — AB
Specific Gravity, Urine: 1.016 (ref 1.005–1.030)
Urobilinogen, UA: 0.2 mg/dL (ref 0.0–1.0)

## 2014-01-23 LAB — COMPREHENSIVE METABOLIC PANEL
ALT: 25 U/L (ref 0–35)
AST: 31 U/L (ref 0–37)
Albumin: 3.6 g/dL (ref 3.5–5.2)
Alkaline Phosphatase: 97 U/L (ref 39–117)
BUN: 19 mg/dL (ref 6–23)
CO2: 29 meq/L (ref 19–32)
CREATININE: 0.95 mg/dL (ref 0.50–1.10)
Calcium: 9.5 mg/dL (ref 8.4–10.5)
Chloride: 105 mEq/L (ref 96–112)
GFR calc Af Amer: 63 mL/min — ABNORMAL LOW (ref >90)
GFR calc non Af Amer: 55 mL/min — ABNORMAL LOW (ref >90)
Glucose, Bld: 222 mg/dL — ABNORMAL HIGH (ref 70–99)
Potassium: 4.4 mEq/L (ref 3.7–5.3)
Sodium: 144 mEq/L (ref 137–147)
Total Protein: 6.6 g/dL (ref 6.0–8.3)

## 2014-01-23 LAB — POCT I-STAT 3, ART BLOOD GAS (G3+)
Bicarbonate: 24.3 mEq/L — ABNORMAL HIGH (ref 20.0–24.0)
O2 Saturation: 99 %
PCO2 ART: 39.1 mmHg (ref 35.0–45.0)
Patient temperature: 37.6
TCO2: 25 mmol/L (ref 0–100)
pH, Arterial: 7.404 (ref 7.350–7.450)
pO2, Arterial: 120 mmHg — ABNORMAL HIGH (ref 80.0–100.0)

## 2014-01-23 LAB — I-STAT ARTERIAL BLOOD GAS, ED
Acid-base deficit: 2 mmol/L (ref 0.0–2.0)
Bicarbonate: 28.2 mEq/L — ABNORMAL HIGH (ref 20.0–24.0)
O2 SAT: 100 %
PCO2 ART: 74 mmHg — AB (ref 35.0–45.0)
Patient temperature: 98.6
TCO2: 30 mmol/L (ref 0–100)
pH, Arterial: 7.189 — CL (ref 7.350–7.450)
pO2, Arterial: 386 mmHg — ABNORMAL HIGH (ref 80.0–100.0)

## 2014-01-23 LAB — GLUCOSE, CAPILLARY
Glucose-Capillary: 148 mg/dL — ABNORMAL HIGH (ref 70–99)
Glucose-Capillary: 128 mg/dL — ABNORMAL HIGH (ref 70–99)
Glucose-Capillary: 112 mg/dL — ABNORMAL HIGH (ref 70–99)
Glucose-Capillary: 117 mg/dL — ABNORMAL HIGH (ref 70–99)

## 2014-01-23 LAB — PROTIME-INR
INR: 0.97 (ref 0.00–1.49)
Prothrombin Time: 12.7 seconds (ref 11.6–15.2)

## 2014-01-23 LAB — URINE MICROSCOPIC-ADD ON

## 2014-01-23 LAB — HEMOGLOBIN A1C
Hgb A1c MFr Bld: 5.8 % — ABNORMAL HIGH (ref <5.7)
Mean Plasma Glucose: 120 mg/dL — ABNORMAL HIGH (ref <117)

## 2014-01-23 LAB — I-STAT TROPONIN, ED: Troponin i, poc: 0.01 ng/mL (ref 0.00–0.08)

## 2014-01-23 LAB — ETHANOL

## 2014-01-23 LAB — APTT: aPTT: 24 seconds (ref 24–37)

## 2014-01-23 MED ORDER — ACETAMINOPHEN 650 MG RE SUPP
650.0000 mg | RECTAL | Status: DC | PRN
  Administered 2014-01-23: 650 mg via RECTAL
  Filled 2014-01-23: qty 1

## 2014-01-23 MED ORDER — LABETALOL HCL 5 MG/ML IV SOLN
20.0000 mg | Freq: Once | INTRAVENOUS | Status: DC
  Filled 2014-01-23: qty 4

## 2014-01-23 MED ORDER — PROPOFOL 10 MG/ML IV EMUL
5.0000 ug/kg/min | INTRAVENOUS | Status: DC
  Administered 2014-01-24: 15 ug/kg/min via INTRAVENOUS
  Filled 2014-01-23: qty 100

## 2014-01-23 MED ORDER — METOPROLOL TARTRATE 1 MG/ML IV SOLN
2.5000 mg | INTRAVENOUS | Status: DC | PRN
Start: 2014-01-23 — End: 2014-01-23

## 2014-01-23 MED ORDER — VITAL HIGH PROTEIN PO LIQD
1000.0000 mL | ORAL | Status: DC
  Filled 2014-01-23 (×2): qty 1000

## 2014-01-23 MED ORDER — BIOTENE DRY MOUTH MT LIQD
15.0000 mL | Freq: Four times a day (QID) | OROMUCOSAL | Status: DC
  Administered 2014-01-23 – 2014-01-24 (×4): 15 mL via OROMUCOSAL

## 2014-01-23 MED ORDER — SENNOSIDES-DOCUSATE SODIUM 8.6-50 MG PO TABS
1.0000 | ORAL_TABLET | Freq: Two times a day (BID) | ORAL | Status: DC
  Administered 2014-01-23: 1 via ORAL
  Filled 2014-01-23 (×4): qty 1

## 2014-01-23 MED ORDER — LABETALOL HCL 5 MG/ML IV SOLN: 10.0000 mg | INTRAVENOUS | Status: DC | PRN

## 2014-01-23 MED ORDER — CHLORHEXIDINE GLUCONATE 0.12 % MT SOLN
15.0000 mL | Freq: Two times a day (BID) | OROMUCOSAL | Status: DC
  Administered 2014-01-23 – 2014-01-24 (×2): 15 mL via OROMUCOSAL
  Filled 2014-01-23 (×2): qty 15

## 2014-01-23 MED ORDER — PROPOFOL 10 MG/ML IV EMUL
INTRAVENOUS | Status: AC
  Filled 2014-01-23: qty 100

## 2014-01-23 MED ORDER — PANTOPRAZOLE SODIUM 40 MG IV SOLR
40.0000 mg | Freq: Every day | INTRAVENOUS | Status: DC
  Administered 2014-01-23: 40 mg via INTRAVENOUS
  Filled 2014-01-23 (×2): qty 40

## 2014-01-23 MED ORDER — INSULIN ASPART 100 UNIT/ML ~~LOC~~ SOLN
0.0000 [IU] | SUBCUTANEOUS | Status: DC
  Administered 2014-01-23: 1 [IU] via SUBCUTANEOUS

## 2014-01-23 MED ORDER — ACETAMINOPHEN 325 MG PO TABS
650.0000 mg | ORAL_TABLET | ORAL | Status: DC | PRN
  Administered 2014-01-23 – 2014-01-24 (×2): 650 mg via ORAL
  Filled 2014-01-23 (×2): qty 2

## 2014-01-23 MED ORDER — PROPOFOL 10 MG/ML IV EMUL
5.0000 ug/kg/min | Freq: Once | INTRAVENOUS | Status: AC
  Administered 2014-01-23: 5 ug/kg/min via INTRAVENOUS

## 2014-01-23 NOTE — ED Notes (Addendum)
Called to trama C for code stroke. PT intubated with 7.0 tube per MD, placed 6 lpm n/c on pt. prior to for passive Oxygenation-low sats,Transported to CT and back. RT will monitor.

## 2014-01-23 NOTE — ED Notes (Signed)
Pt brought to trauma room to Intubate prior to CT scan.

## 2014-01-23 NOTE — Progress Notes (Signed)
UR completed.  Jakhia Buxton, RN BSN [facility] CCM Trauma/Neuro ICU Case Manager [phone]  

## 2014-01-23 NOTE — Progress Notes (Signed)
Family requested that SCDs be removed for pt comfort. RN explained purpose of SCDs and family insisted that they be removed.   Breanna BodilyBethany Dillon Breanna Dillon

## 2014-01-23 NOTE — ED Notes (Signed)
Pt arrived via EMS [facility]- Pt was talking with another resident and went unresponsive at 0820. Pt remained unresponsive with EMS en route. Bp-228/116 Hr-98 CBG-198. 18g Left Forearm.

## 2014-01-23 NOTE — ED Notes (Signed)
Pt is able to pull up left lower extremity, unable to follow commands due to intubation. No movement from upper extremities. Slightly responsive to painful stimuli.

## 2014-01-23 NOTE — ED Notes (Signed)
Pts. ET tube advanced to 24 cm from 20 cm per cxr reading after returning from CT, pt. had adequate return Tidal Volumes prior to Ct scan, ABG drawn @ 1040.

## 2014-01-23 NOTE — ED Notes (Signed)
Pt. transported to 3M10 on ventilator without incident.

## 2014-01-23 NOTE — ED Provider Notes (Signed)
CSN: [id number]     Arrival date & time Dec 10, 2013  [id number] History   First MD Initiated Contact with Patient 0Mar 09, 2015 [phone]     Chief Complaint  Patient presents with  . Code Stroke    @EDPCLEARED @ (Consider location/radiation/quality/duration/timing/severity/associated sxs/prior Treatment) HPI Comments: Pt is a 80 y.o. female with Pmhx as above who presents as code CVA with sudden onset unresponsiveness at 8;20Am at her ALF. Pt reported to have very high BP en route, but nml glucose   Patient is a 80 y.o. female presenting with altered mental status. The history is provided by the EMS personnel. The history is limited by the condition of the patient. No language interpreter was used.  Altered Mental Status Presenting symptoms: unresponsiveness   Severity:  Severe Most recent episode:  Today Episode history:  Single Duration:  1 hour Timing:  Constant Progression:  Unchanged Chronicity:  New Context: nursing home resident (ALF)     Past Medical History  Diagnosis Date  . Spinal stenosis     mild on back MRI, see eval 2007 and 2008 for r leg gait problem ? dyskinesia, MRI chronic changes  . Right bundle branch block   . Pain in thoracic spine   . Abnormality of gait   . Anxiety   . Pain in joint, lower leg   . Unspecified glaucoma   . Migraine without aura, without mention of intractable migraine without mention of status migrainosus   . Osteoporosis, unspecified   . Palpitations   . Subacute delirium 09/13/2013  . COPD (chronic obstructive pulmonary disease)     emphysema  . Lewy body dementia with behavioral disturbance    Past Surgical History  Procedure Laterality Date  . Tonsillectomy    . Cataract extraction      bilateral  . Eye surgery    . Electromagnetic navigation brochoscopy  05/03/2012    Procedure: ELECTROMAGNETIC NAVIGATION BRONCHOSCOPY;  Surgeon: Oretha Milchakesh V Alva, MD;  [facility];  Service: Thoracic;  Laterality: Right;  Video bronchoscopy with  endobronchial navigation  . Bronchoscopy    . Repair extensor tendon Right 04/10/2013    Procedure: CENTRALIZATION EXTENSOR TENDONS RIGHT MIDDLE FINGER, RIGHT RING FINGER, RIGHT SMALL FINGER;  Surgeon: Nicki ReaperGary R Kuzma, MD;  Location: [facility];  Service: Orthopedics;  Laterality: Right;   Family History  Problem Relation Age of Onset  . Osteoarthritis Mother   . Kidney cancer Father    History  Substance Use Topics  . Smoking status: Former Smoker -- 50 years    Types: Cigarettes    Quit date: 07/14/2013  . Smokeless tobacco: Never Used     Comment: 2-5 cigs a day--smoked off and on  . Alcohol Use: Yes     Comment: occ   OB History   Grav Para Term Preterm Abortions TAB SAB Ect Mult Living   3 3             Review of Systems  Unable to perform ROS     Allergies  Codeine phosphate  Home Medications   Prior to Admission medications   Medication Sig Start Date End Date Taking? Authorizing Provider  ALPRAZolam (XANAX) 0.25 MG tablet Take 1 tablet (0.25 mg total) by mouth once. Sample pack for MRI preparation. 11/28/13   Porfirio Mylararmen Dohmeier, MD  beta carotene w/minerals (OCUVITE) tablet Take 1 tablet by mouth daily.    Historical Provider, MD  CALCIUM CARBONATE PO Take 1 tablet by mouth daily.    Historical  Provider, MD  Multiple Vitamin (MULTIVITAMIN WITH MINERALS) TABS Take 1 tablet by mouth daily.    Historical Provider, MD  QUEtiapine (SEROQUEL) 25 MG tablet TAKE ONE TABLET AT BEDTIME. 10/17/13   Melvyn Novasarmen Dohmeier, MD  sertraline (ZOLOFT) 50 MG tablet Take 1 tablet (50 mg total) by mouth daily. 11/28/13   Madelin HeadingsWanda K Panosh, MD  travoprost, benzalkonium, (TRAVATAN Z) 0.004 % ophthalmic solution Place 1 drop into both eyes at bedtime.     Historical Provider, MD  traZODone (DESYREL) 50 MG tablet Take 1 tablet (50 mg total) by mouth at bedtime as needed for sleep. 01/22/14   Carmen Dohmeier, MD   BP 141/74  Pulse 110  Temp(Src) 101.8 F (38.8 C) (Core (Comment))  Resp 18   Ht 5\' 5"  (1.651 m)  Wt 101 lb 6.6 oz (46 kg)  BMI 16.88 kg/m2  SpO2 98% Physical Exam  Constitutional: She appears well-developed and well-nourished. She appears distressed.  HENT:  Head: Normocephalic and atraumatic.  Mouth/Throat: No oropharyngeal exudate.  Eyes:  1mm sluggish BL   Neck: Normal range of motion. Neck supple.  Cardiovascular: Regular rhythm and normal heart sounds.  Tachycardia present.  Exam reveals no gallop and no friction rub.   No murmur heard. Pulmonary/Chest: Breath sounds normal. Tachypnea noted. No respiratory distress. She has no wheezes. She has no rales.  Spontaneous respirations  Abdominal: Soft. Bowel sounds are normal. She exhibits no distension and no mass. There is no tenderness. There is no rebound and no guarding.  Musculoskeletal: Normal range of motion. She exhibits no edema and no tenderness.  Neurological: She is unresponsive. She exhibits abnormal muscle tone. GCS eye subscore is 1.  No spontaneous mvmt  Skin: Skin is warm and dry.  Psychiatric: She has a normal mood and affect.    ED Course  INTUBATION Date/Time: 07-22-2014 7:45 PM Performed by: Toy CookeyHERTY, MEGAN, E Authorized by: Toy CookeyHERTY, MEGAN, E Consent: Verbal consent not obtained. written consent not obtained. The procedure was performed in an emergent situation. Indications: airway protection Intubation method: video-assisted Preoxygenation: none Laryngoscope size: Miller 3 Tube size: 7.0 mm Tube type: cuffed Number of attempts: 1 Cricoid pressure: no Cords visualized: yes Post-procedure assessment: chest rise and CO2 detector Breath sounds: equal Cuff inflated: yes ETT to lip: 17 cm Tube secured with: ETT holder Chest x-ray interpreted by me and radiologist. Chest x-ray findings: endotracheal tube too high Tube repositioned: tube repositioned successfully Patient tolerance: Patient tolerated the procedure well with no immediate complications.   (including critical care  time) Labs Review Labs Reviewed  CBC - Abnormal; Notable for the following:    WBC 13.1 (*)    All other components within normal limits  DIFFERENTIAL - Abnormal; Notable for the following:    Lymphs Abs 5.4 (*)    All other components within normal limits  COMPREHENSIVE METABOLIC PANEL - Abnormal; Notable for the following:    Glucose, Bld 222 (*)    Total Bilirubin <0.2 (*)    GFR calc non Af Amer 55 (*)    GFR calc Af Amer 63 (*)    All other components within normal limits  URINALYSIS, ROUTINE W REFLEX MICROSCOPIC - Abnormal; Notable for the following:    Protein, ur 100 (*)    All other components within normal limits  URINE MICROSCOPIC-ADD ON - Abnormal; Notable for the following:    Casts HYALINE CASTS (*)    All other components within normal limits  GLUCOSE, CAPILLARY - Abnormal; Notable for the following:  Glucose-Capillary 148 (*)    All other components within normal limits  I-STAT ARTERIAL BLOOD GAS, ED - Abnormal; Notable for the following:    pH, Arterial 7.189 (*)    pCO2 arterial 74.0 (*)    pO2, Arterial 386.0 (*)    Bicarbonate 28.2 (*)    All other components within normal limits  POCT I-STAT 3, ART BLOOD GAS (G3+) - Abnormal; Notable for the following:    pO2, Arterial 120.0 (*)    Bicarbonate 24.3 (*)    All other components within normal limits  MRSA PCR SCREENING  ETHANOL  PROTIME-INR  APTT  URINE RAPID DRUG SCREEN (HOSP PERFORMED)  BLOOD GAS, ARTERIAL  HEMOGLOBIN A1C  BLOOD GAS, ARTERIAL  CBC  BASIC METABOLIC PANEL  I-STAT CHEM 8, ED  I-STAT TROPOININ, ED  I-STAT TROPOININ, ED    Imaging Review Ct Head Wo Contrast  Feb 21, 2014   CLINICAL DATA:  Code stroke.  Patient became unresponsive.  EXAM: CT HEAD WITHOUT CONTRAST  TECHNIQUE: Contiguous axial images were obtained from the base of the skull through the vertex without intravenous contrast.  COMPARISON:  12/17/2013  FINDINGS: New pontine acute hemorrhage observed extending up into the  midbrain and down into the medulla, with decompression into the fourth ventricle and with blood products observed extending through the foramina of Luschka. The hyperdense brainstem hematoma is somewhat irregular and patchy. The degree of ventriculomegaly is similar to the recent 12/17/2013 exam including the degree of temporal horn dilatation.  Periventricular white matter and corona radiata hypodensities favor chronic ischemic microvascular white matter disease. No mass lesion is observed.  There is atherosclerotic calcification of the cavernous carotid arteries bilaterally. No separate acute CVA is identified. No separate mass lesion.  IMPRESSION: 1. Acute pontine hematoma with extension into both the medulla and midbrain, and with blood products decompressing into the fourth ventricle. The degree of ventriculomegaly is currently similar to the recent MRI of 12/17/2013. Critical Value/emergent results were called by telephone at the time of interpretation on 02-21-14 at 10:15 AM to Dr. Leroy Kennedy, who verbally acknowledged these results.   Electronically Signed   By: Herbie Baltimore M.D.   On: 02-21-2014 10:16   Dg Chest Port 1 View  Feb 21, 2014   CLINICAL DATA:  Repositioning of endotracheal tube  EXAM: PORTABLE CHEST - 1 VIEW  COMPARISON:  Portable exam 1558 hr compared to 0941 hr  FINDINGS: Tip of endotracheal tube now projects 4.2 cm above carinal.  Nasogastric tube extends into stomach.  Numerous EKG leads and tubing project over chest.  Normal heart size, mediastinal contours and pulmonary vascularity.  Calcification and tortuosity of thoracic aorta.  Emphysematous changes without infiltrate, pleural effusion or pneumothorax.  Healing fracture lateral left tenth rib.  Bones demineralized.  IMPRESSION: Satisfactory endotracheal tube position.  Emphysematous changes without infiltrate.   Electronically Signed   By: Ulyses Southward M.D.   On: Feb 21, 2014 16:19   Dg Chest Port 1 View  2014/02/21   CLINICAL DATA:   Intubation, nasogastric tube placement  EXAM: PORTABLE CHEST - 1 VIEW  COMPARISON:  Portable exam 0941 hr compared to 09/06/2013  FINDINGS: Tip of endotracheal tube projects 11.4 cm above carina, overlying the cervical region.  This can be advanced 7 cm for positioning within thorax.  Nasogastric tube extends into stomach.  Normal heart size, mediastinal contours and pulmonary vascularity.  Atherosclerotic calcification of a minimally tortuous thoracic aorta.  Emphysematous and bronchitic changes consistent with COPD.  Accentuation of interstitial markings in the  perihilar regions with more focal opacity at the right base question infiltrate.  No gross pleural effusion or pneumothorax.  IMPRESSION: Nasogastric tube within stomach.  Tip of endotracheal tube projects over the cervical region 11.4 cm above carina, recommend advancing tube 7 cm; this was called to Dr. Micheline Maze at time of interpretation.  COPD changes with question minimal pulmonary edema and potentially consolidation or atelectasis at right lung base.   Electronically Signed   By: Ulyses Southward M.D.   On: 01/31/2014 10:02     EKG Interpretation   Date/Time:  Wednesday January 23 2014 09:37:18 EDT Ventricular Rate:  145 PR Interval:  81 QRS Duration: 96 QT Interval:  314 QTC Calculation: 488 R Axis:   102 Text Interpretation:  Supraventricular tachycardia with rare sinus beat  Anteroseptal infarct, age indeterminate Baseline wander in lead(s) V1 V6  Confirmed by DOCHERTY  MD, MEGAN [phone]) on 01/20/2014 9:53:22 AM     CRITICAL CARE Performed by: Shanna Cisco Total critical care time: 30 Critical care time was exclusive of separately billable procedures and treating other patients. Critical care was necessary to treat or prevent imminent or life-threatening deterioration. Critical care was time spent personally by me on the following activities: development of treatment plan with patient and/or surrogate as well as nursing,  discussions with consultants, evaluation of patient's response to treatment, examination of patient, obtaining history from patient or surrogate, ordering and performing treatments and interventions, ordering and review of laboratory studies, ordering and review of radiographic studies, pulse oximetry and re-evaluation of patient's condition.  MDM   Final diagnoses:  Stroke due to intracerebral hemorrhage  Acute respiratory failure    Pt is a 80 y.o. female with Pmhx as above who presents as code CVA with sudden onset unresponsiveness at 8;20Am at her ALF. Pt arrives w/ GCS 3, spontaneous respirations, HTN >200 systolic. Pt intubated, ETT placement confirmed and pt taken to CT with suspicion for intracranial hemorrhage. Pt has mild gag reflex during intubation, was seen moving L leg. BP improved spontaneously after intubation. Dr. Cyril Mourning in dept upon pt's arrival. CT confirms  acute pontine hematoma w/ extension into medulla & midbrain as well as 4th ventricle. Ventriculomegaly seen is similar to prior MRI.  Dr. Cyril Mourning and myself had discussion with pt's family regarding current state of illness and diagnosis. Pt will ne admitted to neuro ICU.        Shanna Cisco, MD  [phone]

## 2014-01-23 NOTE — Consult Note (Addendum)
Referring Physician: ED    Chief Complaint: CODE STROKE: UNRESPONSIVENESS.  HPI:                                                                                                                                         Melven SartoriusBarbara W Hedding is an 80 y.o. female with a past medical history significant for anxiety, migraine without aura, Lewy body dementia, brought in as a code stroke due to acute unresponsiveness. She lives at an assisted living facility and was reported to be in her usual state of health until 820 am today when she became completely unresponsive. EMS was summoned and she was brought to the ED where she was comatose with SBP>240. BP came down to 140 without pharmacological intervention. Required intubation in the ED. NIHSS 30. CT brain revealed a large pontine hemorrhage with extension into both the medulla and midbrain, and with blood products decompressing into the fourth ventricle. The degree of ventriculomegaly is currently similar to  the recent MRI of 12/17/2013.  Date last known well: 2014-04-04 Time last known well: 820 am  tPA Given: no, ICH NIHSS: 30  Past Medical History  Diagnosis Date  . Spinal stenosis     mild on back MRI, see eval 2007 and 2008 for r leg gait problem ? dyskinesia, MRI chronic changes  . Right bundle branch block   . Pain in thoracic spine   . Abnormality of gait   . Anxiety   . Pain in joint, lower leg   . Unspecified glaucoma   . Migraine without aura, without mention of intractable migraine without mention of status migrainosus   . Osteoporosis, unspecified   . Palpitations   . Subacute delirium 09/13/2013    Past Surgical History  Procedure Laterality Date  . Tonsillectomy    . Cataract extraction      bilateral  . Eye surgery    . Electromagnetic navigation brochoscopy  05/03/2012    Procedure: ELECTROMAGNETIC NAVIGATION BRONCHOSCOPY;  Surgeon: Oretha Milchakesh V Alva, MD;  [facility];  Service: Thoracic;  Laterality: Right;  Video  bronchoscopy with endobronchial navigation  . Bronchoscopy    . Repair extensor tendon Right 04/10/2013    Procedure: CENTRALIZATION EXTENSOR TENDONS RIGHT MIDDLE FINGER, RIGHT RING FINGER, RIGHT SMALL FINGER;  Surgeon: Nicki ReaperGary R Kuzma, MD;  Location: [facility];  Service: Orthopedics;  Laterality: Right;    Family History  Problem Relation Age of Onset  . Osteoarthritis Mother   . Kidney cancer Father    Social History:  reports that she quit smoking about 6 months ago. Her smoking use included Cigarettes. She smoked 0.00 packs per day for 50 years. She has never used smokeless tobacco. She reports that she drinks alcohol. She reports that she does not use illicit drugs.  Allergies:  Allergies  Allergen Reactions  . Codeine Phosphate Nausea And Vomiting    Reaction  occurred 30 years ago and may no longer be valid, per patient.    Medications:                                                                                                                           I have reviewed the patient's current medications.  ROS:   Unable to obtain due to mental status.                                                                                                                   History obtained from chart review, EMS, and family.  Physical exam: comatose, intubated on propofol. Blood pressure 149/84, pulse 151, resp. rate 17, weight 44.2 kg (97 lb 7.1 oz), SpO2 98.00%. Head: normocephalic. Neck: supple, no bruits, no JVD. Cardiac: no murmurs. Lungs: clear. Abdomen: soft, no tender, no mass. Extremities: no edema. CV: pulses palpable throughout  Neurologic Examination:  Performed before sedation and intubation.                                                                          Mental Status: comatose. Cranial Nerves: pupils 2 mm bilaterally, poorly reactive to light. No gaze preference. Doesn't blink to threat. Face symmetric. Tongue: intubated. Motor: No spontaneous or  pain induced motor activity noted. Sensory: no reaction to pain. Deep Tendon Reflexes:  2 all over Plantars: Right: downgoing   Left: downgoing Cerebellar: Unable to test. Gait: Unable to test.    Results for orders placed during the hospital encounter of 2014-03-12 (from the past 48 hour(s))  CBC     Status: Abnormal   Collection Time    2014-03-12  9:40 AM      Result Value Ref Range   WBC 13.1 (*) 4.0 - 10.5 K/uL   RBC 4.41  3.87 - 5.11 MIL/uL   Hemoglobin 13.0  12.0 - 15.0 g/dL   HCT 16.141.9  09.636.0 - 04.546.0 %   MCV 95.0  78.0 - 100.0 fL   MCH 29.5  26.0 - 34.0 pg   MCHC 31.0  30.0 - 36.0 g/dL   RDW 40.913.2  81.111.5 - 91.415.5 %   Platelets 310  150 - 400 K/uL  DIFFERENTIAL  Status: None   Collection Time    01/10/2014  9:40 AM      Result Value Ref Range   Neutrophils Relative % PENDING  43 - 77 %   Neutro Abs PENDING  1.7 - 7.7 K/uL   Band Neutrophils PENDING  0 - 10 %   Lymphocytes Relative PENDING  12 - 46 %   Lymphs Abs PENDING  0.7 - 4.0 K/uL   Monocytes Relative PENDING  3 - 12 %   Monocytes Absolute PENDING  0.1 - 1.0 K/uL   Eosinophils Relative PENDING  0 - 5 %   Eosinophils Absolute PENDING  0.0 - 0.7 K/uL   Basophils Relative PENDING  0 - 1 %   Basophils Absolute PENDING  0.0 - 0.1 K/uL   WBC Morphology PENDING     RBC Morphology PENDING     Smear Review PENDING     nRBC PENDING  0 /100 WBC   Metamyelocytes Relative PENDING     Myelocytes PENDING     Promyelocytes Absolute PENDING     Blasts PENDING    I-STAT TROPOININ, ED     Status: None   Collection Time    01/29/2014  9:51 AM      Result Value Ref Range   Troponin i, poc 0.01  0.00 - 0.08 ng/mL   Comment 3            Comment: Due to the release kinetics of cTnI,     a negative result within the first hours     of the onset of symptoms does not rule out     myocardial infarction with certainty.     If myocardial infarction is still suspected,     repeat the test at appropriate intervals.   Ct Head Wo  Contrast  01/28/2014   CLINICAL DATA:  Code stroke.  Patient became unresponsive.  EXAM: CT HEAD WITHOUT CONTRAST  TECHNIQUE: Contiguous axial images were obtained from the base of the skull through the vertex without intravenous contrast.  COMPARISON:  12/17/2013  FINDINGS: New pontine acute hemorrhage observed extending up into the midbrain and down into the medulla, with decompression into the fourth ventricle and with blood products observed extending through the foramina of Luschka. The hyperdense brainstem hematoma is somewhat irregular and patchy. The degree of ventriculomegaly is similar to the recent 12/17/2013 exam including the degree of temporal horn dilatation.  Periventricular white matter and corona radiata hypodensities favor chronic ischemic microvascular white matter disease. No mass lesion is observed.  There is atherosclerotic calcification of the cavernous carotid arteries bilaterally. No separate acute CVA is identified. No separate mass lesion.  IMPRESSION: 1. Acute pontine hematoma with extension into both the medulla and midbrain, and with blood products decompressing into the fourth ventricle. The degree of ventriculomegaly is currently similar to the recent MRI of 12/17/2013. Critical Value/emergent results were called by telephone at the time of interpretation on 01/31/2014 at 10:15 AM to Dr. Leroy Kennedy, who verbally acknowledged these results.   Electronically Signed   By: Herbie Baltimore M.D.   On: 01/12/2014 10:16   Dg Chest Port 1 View  01/02/2014   CLINICAL DATA:  Intubation, nasogastric tube placement  EXAM: PORTABLE CHEST - 1 VIEW  COMPARISON:  Portable exam 0941 hr compared to 09/06/2013  FINDINGS: Tip of endotracheal tube projects 11.4 cm above carina, overlying the cervical region.  This can be advanced 7 cm for positioning within thorax.  Nasogastric tube extends into stomach.  Normal heart size, mediastinal contours and pulmonary vascularity.  Atherosclerotic calcification of  a minimally tortuous thoracic aorta.  Emphysematous and bronchitic changes consistent with COPD.  Accentuation of interstitial markings in the perihilar regions with more focal opacity at the right base question infiltrate.  No gross pleural effusion or pneumothorax.  IMPRESSION: Nasogastric tube within stomach.  Tip of endotracheal tube projects over the cervical region 11.4 cm above carina, recommend advancing tube 7 cm; this was called to Dr. Micheline Maze at time of interpretation.  COPD changes with question minimal pulmonary edema and potentially consolidation or atelectasis at right lung base.   Electronically Signed   By: Ulyses Southward M.D.   On: 2014-01-29 10:02    Assessment: 80 y.o. female brought in with profound coma in the context of a large hypertensive pontine hemorrhage with extension into the midbrain-medulla and the IV ventricle. Initial SBP>240.No frank early hydrocephalus as per discussion with neuroradiology. Discussed at length with family. Concern for potential development of hydrocephalus due to IV ventricle blockage. Family uncertain if they will be in favor of ventriculostomy placement if needed. Admit to NICU. Follow ICH protocol. Consulted critical care to assist with ICU management. Maintain SBP<160. Follow up CT brain in am. No antiplatelets or anticoagulants. Stroke team will resume care in am.   Wyatt Portela, MD [facility] [phone]  2014/01/29, 10:31 AM

## 2014-01-23 NOTE — ED Notes (Signed)
Foley was inserted by nurse HAYLEY   RN.NO URINE  RETURN

## 2014-01-23 NOTE — Progress Notes (Signed)
Pt's daughters confirmed that they want pt to be DNR.   Holly BodilyBethany Leigh Freeman Borba

## 2014-01-23 NOTE — Progress Notes (Signed)
Pt's HR increased to 200s briefly, then reduced to 110s-120s. MD aware. BP 112/49. No orders received at this time. CCM NP arrived to bedside and aware.   Breanna Dillon

## 2014-01-23 NOTE — Consult Note (Addendum)
PULMONARY / CRITICAL CARE MEDICINE   Name: Breanna SartoriusBarbara W Dillon MRN: [id number] DOB: 09/14/1930    ADMISSION DATE:  01/18/2014 CONSULTATION DATE: 4/22  REFERRING MD :  Neuro PRIMARY SERVICE: Neuro  CHIEF COMPLAINT:  Pontine bleed  BRIEF PATIENT DESCRIPTION:  [age] yo WF with known dementia , tobacco abuse (Dr. Marchelle Gearingamaswamy dlco 38%) ,tachycardia ,rt BBB,anxiety state and is a NH patient. She was brought to ED this am 0920(4/22) for unresponsiveness and was intubated by EDP for airway protection. She was hypertensive and CT scan revealed large pontine hemorrhage with extension into medulla and midbrain. She is in NSICU, now a DNR per family request, on ventilator and sedation with diprivan. PCCM asked to consult.  SIGNIFICANT EVENTS / STUDIES:  4/22 devastating pontine bleed   LINES / TUBES: 4/22 OTT>>  CULTURES:   ANTIBIOTICS:   HISTORY OF PRESENT ILLNESS:   80 yo WF with known dementia , tobacco abuse(Dr. Ramaswamy dlco 38%) ,tachycardia ,rt BBB,anxiety state and is a NH patient. She was brought to ED this am 0920(4/22) for unresponsiveness and was intubated by EDP for airway protection. She was hypertensive and CT scan revealed large pontine hemorrhage with extension into medulla and midbrain. She is in NSICU, now a DNR per family request, on ventilator and sedation with diprivan. PCCM asked to consult.   PAST MEDICAL HISTORY :  Past Medical History  Diagnosis Date  . Spinal stenosis     mild on back MRI, see eval 2007 and 2008 for r leg gait problem ? dyskinesia, MRI chronic changes  . Right bundle branch block   . Pain in thoracic spine   . Abnormality of gait   . Anxiety   . Pain in joint, lower leg   . Unspecified glaucoma   . Migraine without aura, without mention of intractable migraine without mention of status migrainosus   . Osteoporosis, unspecified   . Palpitations   . Subacute delirium 09/13/2013   Past Surgical History  Procedure Laterality Date  . Tonsillectomy     . Cataract extraction      bilateral  . Eye surgery    . Electromagnetic navigation brochoscopy  05/03/2012    Procedure: ELECTROMAGNETIC NAVIGATION BRONCHOSCOPY;  Surgeon: Oretha Milchakesh V Alva, MD;  [facility];  Service: Thoracic;  Laterality: Right;  Video bronchoscopy with endobronchial navigation  . Bronchoscopy    . Repair extensor tendon Right 04/10/2013    Procedure: CENTRALIZATION EXTENSOR TENDONS RIGHT MIDDLE FINGER, RIGHT RING FINGER, RIGHT SMALL FINGER;  Surgeon: Nicki ReaperGary R Kuzma, MD;  Location: [facility];  Service: Orthopedics;  Laterality: Right;   Prior to Admission medications   Medication Sig Start Date End Date Taking? Authorizing Provider  ALPRAZolam (XANAX) 0.25 MG tablet Take 1 tablet (0.25 mg total) by mouth once. Sample pack for MRI preparation. 11/28/13   Porfirio Mylararmen Dohmeier, MD  beta carotene w/minerals (OCUVITE) tablet Take 1 tablet by mouth daily.    Historical Provider, MD  CALCIUM CARBONATE PO Take 1 tablet by mouth daily.    Historical Provider, MD  Multiple Vitamin (MULTIVITAMIN WITH MINERALS) TABS Take 1 tablet by mouth daily.    Historical Provider, MD  QUEtiapine (SEROQUEL) 25 MG tablet TAKE ONE TABLET AT BEDTIME. 10/17/13   Melvyn Novasarmen Dohmeier, MD  sertraline (ZOLOFT) 50 MG tablet Take 1 tablet (50 mg total) by mouth daily. 11/28/13   Madelin HeadingsWanda K Panosh, MD  travoprost, benzalkonium, (TRAVATAN Z) 0.004 % ophthalmic solution Place 1 drop into both eyes at bedtime.  Historical Provider, MD  traZODone (DESYREL) 50 MG tablet Take 1 tablet (50 mg total) by mouth at bedtime as needed for sleep. 01/22/14   Melvyn Novasarmen Dohmeier, MD   Allergies  Allergen Reactions  . Codeine Phosphate Nausea And Vomiting    Reaction occurred 30 years ago and may no longer be valid, per patient.    FAMILY HISTORY:  Family History  Problem Relation Age of Onset  . Osteoarthritis Mother   . Kidney cancer Father    SOCIAL HISTORY:  reports that she quit smoking about 6 months ago. Her  smoking use included Cigarettes. She smoked 0.00 packs per day for 50 years. She has never used smokeless tobacco. She reports that she drinks alcohol. She reports that she does not use illicit drugs.  REVIEW OF SYSTEMS:  na  SUBJECTIVE:   VITAL SIGNS: Temp:  [99 F (37.2 C)-100.9 F (38.3 C)] 100.8 F (38.2 C) (04/22 1230) Pulse Rate:  [102-151] 127 (04/22 1230) Resp:  [11-24] 24 (04/22 1230) BP: (91-234)/(53-122) 120/65 mmHg (04/22 1230) SpO2:  [98 %-100 %] 99 % (04/22 1230) FiO2 (%):  [40 %-100 %] 40 % (04/22 1230) Weight:  [44.2 kg (97 lb 7.1 oz)-46 kg (101 lb 6.6 oz)] 46 kg (101 lb 6.6 oz) (04/22 1130) HEMODYNAMICS:   VENTILATOR SETTINGS: Vent Mode:  [-] PRVC FiO2 (%):  [40 %-100 %] 40 % Set Rate:  [18 bmp-24 bmp] 24 bmp Vt Set:  [420 mL] 420 mL PEEP:  [5 cmH20] 5 cmH20 Plateau Pressure:  [10 cmH20-14 cmH20] 14 cmH20 INTAKE / OUTPUT: Intake/Output   None     PHYSICAL EXAMINATION: General:  EWF on vent unresponsive Neuro: Unresponsive, no wd to stimuli HEENT:  Ott->vent, no jvd, PEL, NR pinpoint Cardiovascular:  HSIR  Lungs:  CTA Abdomen: +bs Musculoskeletal:  intact Skin:  warm  LABS:  CBC  Recent Labs Lab 01/31/2014 0940  WBC 13.1*  HGB 13.0  HCT 41.9  PLT 310   Coag's  Recent Labs Lab 01/10/2014 0940  APTT 24  INR 0.97   BMET  Recent Labs Lab 01/02/2014 0940  NA 144  K 4.4  CL 105  CO2 29  BUN 19  CREATININE 0.95  GLUCOSE 222*   Electrolytes  Recent Labs Lab 01/22/2014 0940  CALCIUM 9.5   Sepsis Markers No results found for this basename: LATICACIDVEN, PROCALCITON, O2SATVEN,  in the last 168 hours ABG  Recent Labs Lab 01/31/2014 1045  PHART 7.189*  PCO2ART 74.0*  PO2ART 386.0*   Liver Enzymes  Recent Labs Lab 01/05/2014 0940  AST 31  ALT 25  ALKPHOS 97  BILITOT <0.2*  ALBUMIN 3.6   Cardiac Enzymes No results found for this basename: TROPONINI, PROBNP,  in the last 168 hours Glucose No results found for this basename:  GLUCAP,  in the last 168 hours  Imaging Ct Head Wo Contrast  01/05/2014   CLINICAL DATA:  Code stroke.  Patient became unresponsive.  EXAM: CT HEAD WITHOUT CONTRAST  TECHNIQUE: Contiguous axial images were obtained from the base of the skull through the vertex without intravenous contrast.  COMPARISON:  12/17/2013  FINDINGS: New pontine acute hemorrhage observed extending up into the midbrain and down into the medulla, with decompression into the fourth ventricle and with blood products observed extending through the foramina of Luschka. The hyperdense brainstem hematoma is somewhat irregular and patchy. The degree of ventriculomegaly is similar to the recent 12/17/2013 exam including the degree of temporal horn dilatation.  Periventricular white matter and  corona radiata hypodensities favor chronic ischemic microvascular white matter disease. No mass lesion is observed.  There is atherosclerotic calcification of the cavernous carotid arteries bilaterally. No separate acute CVA is identified. No separate mass lesion.  IMPRESSION: 1. Acute pontine hematoma with extension into both the medulla and midbrain, and with blood products decompressing into the fourth ventricle. The degree of ventriculomegaly is currently similar to the recent MRI of 12/17/2013. Critical Value/emergent results were called by telephone at the time of interpretation on 14-Feb-2014 at 10:15 AM to Dr. Leroy Kennedy, who verbally acknowledged these results.   Electronically Signed   By: Herbie Baltimore M.D.   On: 02/14/14 10:16   Dg Chest Port 1 View  02/14/2014   CLINICAL DATA:  Intubation, nasogastric tube placement  EXAM: PORTABLE CHEST - 1 VIEW  COMPARISON:  Portable exam 0941 hr compared to 09/06/2013  FINDINGS: Tip of endotracheal tube projects 11.4 cm above carina, overlying the cervical region.  This can be advanced 7 cm for positioning within thorax.  Nasogastric tube extends into stomach.  Normal heart size, mediastinal contours and  pulmonary vascularity.  Atherosclerotic calcification of a minimally tortuous thoracic aorta.  Emphysematous and bronchitic changes consistent with COPD.  Accentuation of interstitial markings in the perihilar regions with more focal opacity at the right base question infiltrate.  No gross pleural effusion or pneumothorax.  IMPRESSION: Nasogastric tube within stomach.  Tip of endotracheal tube projects over the cervical region 11.4 cm above carina, recommend advancing tube 7 cm; this was called to Dr. Micheline Maze at time of interpretation.  COPD changes with question minimal pulmonary edema and potentially consolidation or atelectasis at right lung base.   Electronically Signed   By: Ulyses Southward M.D.   On: 2014/02/14 10:02    ASSESSMENT / PLAN:  PULMONARY A: VDRF in setting of catastrophic pontine bleed     COPD       Pulmonary nodule(moot at this point)  P:   Vent bundle BD as needed abg reviewed, reduce MV, rate, no further ABg required pcxr in am  ett advanced? Repeat pcxr No SBT planned, concern resp drive in future, will need apnea alarms and Tv alarms  CARDIOVASCULAR A:tachycardia, suspected Afib.ay be from cerebral edema     HTN P:  dnr noted Low dose BB IV as needed MAP goals per neuro  RENAL A: No acute issue P:  Avoid free water saline  GASTROINTESTINAL A:  Gi protection P:   PPI Start TF   HEMATOLOGIC A:  Post pontine bleed P:  Monitor cbc No anticoagulation  scd  INFECTIOUS A: No acute process, wbc elevated P:   No abx Follow up pcxr rul closely  ENDOCRINE A:  Hyperglycemia  P:   SSI  NEUROLOGIC A:  Pontine hemorrhage       Hyperthemia most likely secondary to pontine bleed.  P:   Per Neuro Tylenol Ice bags Avoid fevers Prognosis appears poor, consider comfort care  TODAY'S SUMMARY:  Prognosis appears poor, reduce MV , adjust ETT if needed further, no free water, scd  Brett Canales Minor ACNP Ad[facility] Pollack PCCM Pager [phone] till 3 pm If no answer  page [phone] 02/14/14, 1:40 PM  Ccm time 30 min   I have fully amined this patient and agree with above findings.    And edited infull  Mcarthur Rossetti. Tyson Alias, MD, FACP Pgr: [phone] [facility]  D/w daughter. NO escalation of care at all, no pressors, vent changes They will consider comfort  in am   Mcarthur Rossetti. Tyson Alias, MD, FACP Pgr: [phone] [facility]

## 2014-01-23 NOTE — Code Documentation (Addendum)
code stroke called at 0920. [facility] via EMS at [phone].  AS per EMS, patient lives in [facility], was talking with staff member when she became unresponsive. LSN 0820, NIHSS 30 Patient very hypertensive, 220's/110's, HR 150's en route on NRB.  [facility] to Trauma room C for intubation by EDP.  Patient BP 234/122.  Patient to CT scan at 0953.  BP now 149/84, corrected by self.  CT scan + bleed, awaiting for family to arrive

## 2014-01-24 LAB — GLUCOSE, CAPILLARY
Glucose-Capillary: 129 mg/dL — ABNORMAL HIGH (ref 70–99)
Glucose-Capillary: 122 mg/dL — ABNORMAL HIGH (ref 70–99)

## 2014-01-24 MED ORDER — MORPHINE SULFATE 10 MG/ML IJ SOLN
1.0000 mg/h | INTRAVENOUS | Status: DC
  Administered 2014-01-24: 2 mg/h via INTRAVENOUS
  Filled 2014-01-24: qty 10

## 2014-01-24 MED ORDER — LORAZEPAM 2 MG/ML IJ SOLN
1.0000 mg/h | INTRAVENOUS | Status: DC
  Administered 2014-01-24: 1 mg/h via INTRAVENOUS
  Filled 2014-01-24: qty 25

## 2014-01-24 MED ORDER — LORAZEPAM BOLUS VIA INFUSION
2.0000 mg | INTRAVENOUS | Status: DC | PRN
  Filled 2014-01-24: qty 5

## 2014-01-24 MED ORDER — MORPHINE BOLUS VIA INFUSION
5.0000 mg | INTRAVENOUS | Status: DC | PRN
  Administered 2014-01-24: 2 mg via INTRAVENOUS
  Filled 2014-01-24: qty 20

## 2014-01-25 ENCOUNTER — Telehealth: Payer: Self-pay | Admitting: Neurology

## 2014-01-25 NOTE — Telephone Encounter (Signed)
Sheppard PentonLee Ann from [facility] [employer] calling to check if we received patient's death certificate and if Dr. Pearlean BrownieSethi will sign it. Please return [facility] and advise.

## 2014-01-25 NOTE — Telephone Encounter (Signed)
Called Sheppard PentonLee Ann at [employer] to inform her that Dr. Pearlean BrownieSethi has already signed death certificate and given to medical records to fax back. Sheppard PentonLee Ann verbalized understanding.

## 2014-01-28 NOTE — Discharge Summary (Signed)
Patient ID: Breanna SartoriusBarbara W Belair MRN: [id number] DOB/AGE: 78/02/1930 78 y.o.  Admit date: 01/05/2014 Death date: 01/11/2014 at 1700 hours  Admission Diagnoses: Brain hemorrhage  Cause of Death:  Respiratory failure secondary to increased intracranial pressure from large brain stem hemorrhage related to malignant hypertension. Patient made DO NOT RESUSCITATE and comfort care by family  Pertinent Medical Diagnosis: Active Problems:   Right bundle branch block   TACHYCARDIA   COPD (chronic obstructive pulmonary disease)   Pulmonary nodule   Dementia with Lewy bodies   Stroke due to intracerebral hemorrhage   Acute respiratory failure   Hospital Course:Breanna SartoriusBarbara W Rozario was an 80 y.o. female with a past medical history significant for anxiety, migraine without aura, Lewy body dementia, brought in as a code stroke due to acute unresponsiveness.  She lived at an assisted living facility and was reported to be in her usual state of health until 820 am on 01/09/2014 when she became completely unresponsive. EMS was summoned and she was brought to the ED where she was comatose with SBP>240. BP came down to 140 without pharmacological intervention. Required intubation in the ED.  NIHSS 30. CT brain revealed a large pontine hemorrhage with extension into both the medulla and midbrain, and with blood products decompressing into the fourth ventricle. The degree of ventriculomegaly is currently similar to the recent MRI of 12/17/2013. Patient was not administered TPA secondary to hemorrhage. She was admitted to the neuro ICU for further evaluation and treatment. She was intubated but neurological exam is quite poor. She remained comatose and unresponsive with results corneal reflexes are absent doll's eye movements and no motor response to sternal rub he decerebrate posturing of the upper extremities. The patient's prognosis was felt to be quite poor and this was discussed with family. The family knew about the  patient's wishes and did not want to prolong life support and hence made the patient DO NOT RESUSCITATE .The patient was terminally extubated and passed away shortly thereafter. CT of the brain 01/10/2014 1. Acute pontine hematoma with extension into both the medulla and midbrain, and with blood products decompressing into the fourth ventricle. The degree of ventriculomegaly is currently similar to the recent MRI of 12/17/2013.      Signed: Micki Rileyramod S Torrion Witter 01/28/2014, 3:03 PM

## 2014-02-01 NOTE — Progress Notes (Signed)
RN present during Stroke MD discussion with pt's 2 daughters and son regarding diagnosis and prognosis. Family understood and expressed wishes to withdraw life support this afternoon.   Breanna Dillon

## 2014-02-01 NOTE — Consult Note (Signed)
PULMONARY / CRITICAL CARE MEDICINE   Name: Breanna SartoriusBarbara W Dillon MRN: [id number] DOB: 05/03/1930    ADMISSION DATE:  01/11/2014 CONSULTATION DATE: 01/06/2014  REFERRING MD :  Neuro  CHIEF COMPLAINT:  Pontine bleed  BRIEF PATIENT DESCRIPTION:  80 yo female presented to ED from nursing home with altered mental status and required intubation for airway protection.  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  Has hx of dementia.    SIGNIFICANT EVENTS: 4/22 Admit with pontine hemorrhage, VDRF >> made DNR  STUDIES:  4/22 CT head >> acute pontine hematoma with extension into both medulla and midbrain  LINES / TUBES: 4/22 ETT>>  SUBJECTIVE:  Not following commands.  VITAL SIGNS: Temp:  [97.7 F (36.5 C)-101.8 F (38.8 C)] 98.4 F (36.9 C) (04/23 0600) Pulse Rate:  [84-151] 91 (04/23 0600) Resp:  [11-24] 18 (04/23 0600) BP: (91-234)/(53-122) 102/64 mmHg (04/23 0600) SpO2:  [97 %-100 %] 100 % (04/23 0600) FiO2 (%):  [30 %-100 %] 30 % (04/23 0600) Weight:  [97 lb 7.1 oz (44.2 kg)-101 lb 6.6 oz (46 kg)] 101 lb 6.6 oz (46 kg) (04/22 1130) VENTILATOR SETTINGS: Vent Mode:  [-] PRVC FiO2 (%):  [30 %-100 %] 30 % Set Rate:  [18 bmp-24 bmp] 18 bmp Vt Set:  [420 mL] 420 mL PEEP:  [5 cmH20] 5 cmH20 Plateau Pressure:  [10 cmH20-17 cmH20] 17 cmH20 INTAKE / OUTPUT: Intake/Output     04/22 0701 - 04/23 0700 04/23 0701 - 04/24 0700   I.V. (mL/kg) 23.3 (0.5)    Total Intake(mL/kg) 23.3 (0.5)    Urine (mL/kg/hr) 1495    Total Output 1495     Net -1471.7            PHYSICAL EXAMINATION: General: no distress Neuro: not following commands, withdraws to stimuli HEENT: ETT in place Cardiovascular: regular Lungs: no wheeze Abdomen: soft, non tender Musculoskeletal: no edema Skin: no rashes  LABS:  CBC  Recent Labs Lab 01/22/2014 0940  WBC 13.1*  HGB 13.0  HCT 41.9  PLT 310   Coag's  Recent Labs Lab 01/06/2014 0940  APTT 24  INR  0.97   BMET  Recent Labs Lab 01/16/2014 0940  NA 144  K 4.4  CL 105  CO2 29  BUN 19  CREATININE 0.95  GLUCOSE 222*   Electrolytes  Recent Labs Lab 01/07/2014 0940  CALCIUM 9.5   ABG  Recent Labs Lab 01/22/2014 1045 01/29/2014 1359  PHART 7.189* 7.404  PCO2ART 74.0* 39.1  PO2ART 386.0* 120.0*   Liver Enzymes  Recent Labs Lab 01/22/2014 0940  AST 31  ALT 25  ALKPHOS 97  BILITOT <0.2*  ALBUMIN 3.6   Glucose  Recent Labs Lab 01/02/2014 1537 01/22/2014 1715 01/27/2014 1939 01/22/2014 2313 08/26/14 0312  GLUCAP 148* 128* 112* 117* 129*    Imaging Ct Head Wo Contrast  01/26/2014   CLINICAL DATA:  Code stroke.  Patient became unresponsive.  EXAM: CT HEAD WITHOUT CONTRAST  TECHNIQUE: Contiguous axial images were obtained from the base of the skull through the vertex without intravenous contrast.  COMPARISON:  12/17/2013  FINDINGS: New pontine acute hemorrhage observed extending up into the midbrain and down into the medulla, with decompression into the fourth ventricle and with blood products observed extending through the foramina of Luschka. The hyperdense brainstem hematoma is somewhat irregular and patchy. The degree of ventriculomegaly is similar to the recent 12/17/2013 exam including the degree of temporal horn dilatation.  Periventricular white matter and corona radiata hypodensities favor chronic ischemic microvascular white matter disease. No mass lesion is observed.  There is atherosclerotic calcification of the cavernous carotid arteries bilaterally. No separate acute CVA is identified. No separate mass lesion.  IMPRESSION: 1. Acute pontine hematoma with extension into both the medulla and midbrain, and with blood products decompressing into the fourth ventricle. The degree of ventriculomegaly is currently similar to the recent MRI of 12/17/2013. Critical Value/emergent results were called by telephone at the time of interpretation on 01/20/2014 at 10:15 AM to Dr. Leroy Kennedy, who  verbally acknowledged these results.   Electronically Signed   By: Herbie Baltimore M.D.   On: 01/14/2014 10:16   Dg Chest Port 1 View  01/14/2014   CLINICAL DATA:  Repositioning of endotracheal tube  EXAM: PORTABLE CHEST - 1 VIEW  COMPARISON:  Portable exam 1558 hr compared to 0941 hr  FINDINGS: Tip of endotracheal tube now projects 4.2 cm above carinal.  Nasogastric tube extends into stomach.  Numerous EKG leads and tubing project over chest.  Normal heart size, mediastinal contours and pulmonary vascularity.  Calcification and tortuosity of thoracic aorta.  Emphysematous changes without infiltrate, pleural effusion or pneumothorax.  Healing fracture lateral left tenth rib.  Bones demineralized.  IMPRESSION: Satisfactory endotracheal tube position.  Emphysematous changes without infiltrate.   Electronically Signed   By: Ulyses Southward M.D.   On: 01/07/2014 16:19   Dg Chest Port 1 View  01/03/2014   CLINICAL DATA:  Intubation, nasogastric tube placement  EXAM: PORTABLE CHEST - 1 VIEW  COMPARISON:  Portable exam 0941 hr compared to 09/06/2013  FINDINGS: Tip of endotracheal tube projects 11.4 cm above carina, overlying the cervical region.  This can be advanced 7 cm for positioning within thorax.  Nasogastric tube extends into stomach.  Normal heart size, mediastinal contours and pulmonary vascularity.  Atherosclerotic calcification of a minimally tortuous thoracic aorta.  Emphysematous and bronchitic changes consistent with COPD.  Accentuation of interstitial markings in the perihilar regions with more focal opacity at the right base question infiltrate.  No gross pleural effusion or pneumothorax.  IMPRESSION: Nasogastric tube within stomach.  Tip of endotracheal tube projects over the cervical region 11.4 cm above carina, recommend advancing tube 7 cm; this was called to Dr. Micheline Maze at time of interpretation.  COPD changes with question minimal pulmonary edema and potentially consolidation or atelectasis at  right lung base.   Electronically Signed   By: Ulyses Southward M.D.   On: 01/23/2014 10:02    ASSESSMENT / PLAN:  PULMONARY A:  Acute respiratory failure 2nd to altered mental status from pontine bleed. Hx of COPD, pulmonary nodule. P:   Continue vent support until family decide about goals of care  CARDIOVASCULAR A: HTN emergency. P:  Monitor hemodynamics  RENAL A:  No acute issues. P:   Monitor clinically  GASTROINTESTINAL A:   GI protection. P:   PPI   HEMATOLOGIC A:   No acute issues. P:  Family declined use of SCD's  INFECTIOUS A:  Fever >> likely central; no evidence for infection. P:   Monitor clinically  ENDOCRINE A:   Hyperglycemia. P:   Not checking CBG's with plan for comfort care  NEUROLOGIC A:   Acute encephalopathy 2nd to pontine hemorrhage. P:   Per neurology  Goals of care >> DNR.  Tentative plan to implement comfort measures when family arrives on 4/23.  Coralyn Helling, MD [facility] Pulmonary/Critical Care 02-19-2014, 7:44 AM Pager:  [phone] After  3pm call: [phone]

## 2014-02-01 NOTE — Progress Notes (Signed)
OT Cancellation Note  Patient Details Name: Breanna SartoriusBarbara W Dillon MRN: [id number] DOB: 07/08/1930   Cancelled Treatment:    Reason Eval/Treat Not Completed: Other (comment) Pt comfort care. Plan is to remove from vent. OT signing off. Lorinda CreedHilary S Kameah Rawl HomeHilary Gulianna Hornsby, [place] CarolinaOTR/L  [phone] 01/07/2014 01/11/2014, 9:14 AM

## 2014-02-01 NOTE — Progress Notes (Addendum)
DOD: 01/30/2014 TOD: 17:00  Apnea and asystole noted on monitor. Lennie MuckleBethany Ariauna Farabee, RN & Marlana Latusasey Bruno, RN auscultated and no respiratory or heart sounds were noted. Pt's daughter and granddaughters at bedside. Emotional support given.   Labrea Eccleston Leigh Myndi Wamble   Morphine drip wasted 1 mg/mL, 92cc. Ativan drip wasted 1 mg/mL, 46cc. Jarome LamasKatie Hyatt, RN as witness.

## 2014-02-01 NOTE — Significant Event (Signed)
Family ready to proceed with withdrawal of vent support.  Orders placed.  Coralyn HellingVineet Eriyana Sweeten, MD [facility] 06-03-14, 3:10 PM Pager:  [phone] After 3pm call: [phone]

## 2014-02-01 NOTE — Progress Notes (Signed)
Morphine & Ativan drips infusing. 15:16 pt extubated per family's wishes to withdraw life support. Emotional support given to family.   Holly BodilyBethany Leigh Bresha Hosack

## 2014-02-01 NOTE — Progress Notes (Signed)
Speech Language Pathology Discharge Patient Details Name: Melven SartoriusBarbara W Ressel MRN: [id number] DOB: 10/29/1929 Today's Date: 2014/09/20 Time:  -     Patient discharged from SLP services secondary to medical decline - will need to re-order SLP to resume therapy services.  Intubated and RN stated plans to withdraw care.   Please see latest therapy progress note for current level of functioning and progress toward goals.    Progress and discharge plan discussed with patient and/or caregiver: Patient unable to participate in discharge planning and no caregivers available  GO     Royce MacadamiaLisa Willis Maxmillian Carsey M.Ed [employer]-SLP Pager [phone]  2014/09/20

## 2014-02-01 NOTE — Procedures (Signed)
Extubation Procedure Note  Patient Details:   Name: Breanna SartoriusBarbara W Dillon DOB: 09/21/1930 MRN: [id number]   Airway Documentation:     Evaluation  O2 sats: Terminal extubation. Monitor set to comfort care mode. Complications: Terminal extubation. Patient did tolerate procedure well. Bilateral Breath Sounds: Diminished Suctioning: Airway   Antoine Pochemily Caroline Trogdon 01/23/2014, 4:53 PM

## 2014-02-01 NOTE — Progress Notes (Signed)
Nutrition Brief Note  Chart reviewed. Pt now transitioning to comfort care.  No further nutrition interventions warranted at this time.  Please re-consult as needed.   Hussam Muniz RD, LDN, CNSC [phone] Pager [phone] After Hours Pager    

## 2014-02-01 NOTE — Progress Notes (Signed)
Chaplain responded to referral from nurse concerning withdrawal of care. Many family members were present, as well as the pt's pastor. Breanna Dillon stated that they were currently making arrangements for [facility] [employer] [facility]. Family seems to have good support in pastor. Chaplain available as needed.

## 2014-02-01 NOTE — Progress Notes (Signed)
Approximately 50 cc propofol WIS

## 2014-02-01 NOTE — Progress Notes (Signed)
Stroke Team Progress Note  HISTORY Breanna Dillon is an 80 y.o. female with a past medical history significant for anxiety, migraine without aura, Lewy body dementia, brought in as a code stroke due to acute unresponsiveness.   She lives at an assisted living facility and was reported to be in her usual state of health until 820 am today 01/29/2014 when she became completely unresponsive. EMS was summoned and she was brought to the ED where she was comatose with SBP>240. BP came down to 140 without pharmacological intervention. Required intubation in the ED.  NIHSS 30. CT brain revealed a large pontine hemorrhage with extension into both the medulla and midbrain, and with blood products decompressing into the fourth ventricle. The degree of ventriculomegaly is currently similar to the recent MRI of 12/17/2013. Patient was not administered TPA secondary to hemorrhage. She was admitted to the neuro ICU for further evaluation and treatment.  SUBJECTIVE Her family is at the bedside. They understand her grave condition. They are prepared to withdraw care.   OBJECTIVE Most recent Vital Signs: Filed Vitals:   11-Sep-2014 0400 11-Sep-2014 0500 11-Sep-2014 0600 11-Sep-2014 0740  BP: 128/68 106/66 102/64 99/65  Pulse: 87 89 91 92  Temp: 98.1 F (36.7 C) 97.7 F (36.5 C) 98.4 F (36.9 C)   TempSrc:      Resp: 18 18 18 21   Height:      Weight:      SpO2: 100% 100% 100% 97%   CBG (last 3)   Recent Labs  01/18/2014 1939 01/07/2014 2313 11-Sep-2014 0312  GLUCAP 112* 117* 129*    IV Fluid Intake:   . propofol 15 mcg/kg/min (11-Sep-2014 0600)    MEDICATIONS  . antiseptic oral rinse  15 mL Mouth Rinse QID  . chlorhexidine  15 mL Mouth Rinse BID  . labetalol  20 mg Intravenous Once  . pantoprazole (PROTONIX) IV  40 mg Intravenous QHS  . senna-docusate  1 tablet Oral BID   PRN:  acetaminophen, acetaminophen, labetalol  Diet:  NPO  Activity:  Bedrest DVT Prophylaxis:  SCDs refused by family  CLINICALLY  SIGNIFICANT STUDIES Basic Metabolic Panel:   Recent Labs Lab 01/27/2014 0940  NA 144  K 4.4  CL 105  CO2 29  GLUCOSE 222*  BUN 19  CREATININE 0.95  CALCIUM 9.5   Liver Function Tests:   Recent Labs Lab 01/30/2014 0940  AST 31  ALT 25  ALKPHOS 97  BILITOT <0.2*  PROT 6.6  ALBUMIN 3.6   CBC:   Recent Labs Lab 01/06/2014 0940  WBC 13.1*  NEUTROABS 6.4  HGB 13.0  HCT 41.9  MCV 95.0  PLT 310   Coagulation:   Recent Labs Lab 01/19/2014 0940  LABPROT 12.7  INR 0.97   Cardiac Enzymes: No results found for this basename: CKTOTAL, CKMB, CKMBINDEX, TROPONINI,  in the last 168 hours Urinalysis:   Recent Labs Lab 01/04/2014 1355  COLORURINE YELLOW  LABSPEC 1.016  PHURINE 6.5  GLUCOSEU NEGATIVE  HGBUR NEGATIVE  BILIRUBINUR NEGATIVE  KETONESUR NEGATIVE  PROTEINUR 100*  UROBILINOGEN 0.2  NITRITE NEGATIVE  LEUKOCYTESUR NEGATIVE   Lipid Panel    Component Value Date/Time   CHOL 224* 03/07/2012 1105   TRIG 67.0 03/07/2012 1105   HDL 91.30 03/07/2012 1105   CHOLHDL 2 03/07/2012 1105   VLDL 13.4 03/07/2012 1105   HgbA1C  Lab Results  Component Value Date   HGBA1C 5.8* 01/15/2014    Urine Drug Screen:     Component Value  Date/Time   LABOPIA NONE DETECTED 09-02-14 1355   COCAINSCRNUR NONE DETECTED 09-02-14 1355   LABBENZ NONE DETECTED 09-02-14 1355   AMPHETMU NONE DETECTED 09-02-14 1355   THCU NONE DETECTED 09-02-14 1355   LABBARB NONE DETECTED 09-02-14 1355    Alcohol Level:   Recent Labs Lab Dec 04, 2013 0940  ETH <11    CT of the brain  09-02-14    1. Acute pontine hematoma with extension into both the medulla and midbrain, and with blood products decompressing into the fourth ventricle. The degree of ventriculomegaly is currently similar to the recent MRI of 12/17/2013.   CXR   09-02-14   Satisfactory endotracheal tube position.  Emphysematous changes without infiltrate.    09-02-14   Nasogastric tube within stomach.  Tip of endotracheal tube projects  over the cervical region 11.4 cm above carina, recommend advancing tube 7 cm; this was called to Dr. Micheline Mazeocherty at time of interpretation.  COPD changes with question minimal pulmonary edema and potentially consolidation or atelectasis at right lung base.     EKG  Sinus tachycardia, Right bundle branch block. Possible Lateral infarct , age undetermined. Since last tracing heart rate has slowed slightly and righta bundle-branch block is more easily seen. BRACKBILL.   Physical Exam   Frail elderly Caucasian lady who is intubated.off propofol.  Afebrile. Head is nontraumatic. Neck is supple without bruit.  . Cardiac exam no murmur or gallop. Lungs are clear to auscultation. Distal pulses are well felt. Neurological Exam : Comatose and unresponsive. Pupils are 4 mm equal reactive. Corneal reflexes are present. Dolls eye movement are sent. Fundi were not visualized. No facial grimacing to pain. Sternal rub leads tube bilateral decerebrate posturing in the upper extremities. Semipurposeful lower extremity withdraws bilateral to painful stimuli. Plantars upgoing. ASSESSMENT Ms. Breanna Dillon is a 80 y.o. female presenting with unreponsiveness. Imagian confirms pontine hemoarrhge with intraventricular extension into the 4th venticle. Hemorrhage felt to be secondary to malignant hypertension with BP 234/122 on admission.  On no antithrombotic prior to admission.  Patient with resultant VDRF, comatose state. Stroke work up underway.   lewey body dementia  Cigarette smoker  etoh use  Hospital day # 1  TREATMENT/PLAN  Family desires comfort care. They are having other family members to come see her. Once they have all visited, they will remove the ventilator.  Withdrawal care orders by CCM once patient's family is ready  Dr. Pearlean BrownieSethi discussed diagnosis, prognosis,  treatment options and plan of care with family at the bedside.   Annie MainSharon Biby, MSN, RN, ANVP-BC, AGPCNP-BC [facility] Pager:  [phone] 01/13/2014 8:48 AM This patient is critically ill and at significant risk of neurological worsening, death and care requires constant monitoring of vital signs, hemodynamics,respiratory and cardiac monitoring,review of multiple databases, neurological assessment, discussion with family, other specialists and medical decision making of high complexity. I spent 30 minutes of neurocritical care time  in the care of  this patient. I have personally obtained a history, examined the patient, evaluated imaging results, and formulated the assessment and plan of care. I agree with the above. Delia HeadyPramod Sethi, MD   To contact Stroke Continuity provider, please refer to [email or web address]. After hours, contact General Neurology

## 2014-02-01 DEATH — deceased

## 2014-02-18 ENCOUNTER — Ambulatory Visit: Payer: Medicare Other | Admitting: Internal Medicine

## 2014-05-09 NOTE — Telephone Encounter (Signed)
Noted  

## 2014-06-05 ENCOUNTER — Ambulatory Visit: Payer: Medicare Other | Admitting: Neurology

## 2014-08-05 ENCOUNTER — Encounter (HOSPITAL_COMMUNITY): Payer: Self-pay | Admitting: *Deleted
